# Patient Record
Sex: Male | Born: 1935 | Race: White | Hispanic: No | Smoking: Never smoker
Health system: Southern US, Community
[De-identification: ages and names within clinical notes are randomized; demographics above are authoritative.]

## PROBLEM LIST (undated history)

## (undated) DIAGNOSIS — K219 Gastro-esophageal reflux disease without esophagitis: Secondary | ICD-10-CM

## (undated) DIAGNOSIS — Z978 Presence of other specified devices: Secondary | ICD-10-CM

## (undated) DIAGNOSIS — Z8546 Personal history of malignant neoplasm of prostate: Secondary | ICD-10-CM

## (undated) DIAGNOSIS — M8580 Other specified disorders of bone density and structure, unspecified site: Secondary | ICD-10-CM

## (undated) DIAGNOSIS — I4819 Other persistent atrial fibrillation: Secondary | ICD-10-CM

## (undated) DIAGNOSIS — D509 Iron deficiency anemia, unspecified: Secondary | ICD-10-CM

## (undated) DIAGNOSIS — I5022 Chronic systolic (congestive) heart failure: Secondary | ICD-10-CM

## (undated) DIAGNOSIS — I4891 Unspecified atrial fibrillation: Secondary | ICD-10-CM

## (undated) DIAGNOSIS — C61 Malignant neoplasm of prostate: Secondary | ICD-10-CM

## (undated) DIAGNOSIS — E785 Hyperlipidemia, unspecified: Secondary | ICD-10-CM

## (undated) DIAGNOSIS — N182 Chronic kidney disease, stage 2 (mild): Secondary | ICD-10-CM

## (undated) DIAGNOSIS — M5010 Cervical disc disorder with radiculopathy, unspecified cervical region: Secondary | ICD-10-CM

## (undated) DIAGNOSIS — M4800 Spinal stenosis, site unspecified: Secondary | ICD-10-CM

## (undated) DIAGNOSIS — I350 Nonrheumatic aortic (valve) stenosis: Secondary | ICD-10-CM

## (undated) HISTORY — DX: Spinal stenosis, site unspecified: M48.00

## (undated) HISTORY — DX: Gastro-esophageal reflux disease without esophagitis: K21.9

## (undated) HISTORY — DX: Chronic kidney disease, stage 2 (mild): N18.2

## (undated) HISTORY — DX: Iron deficiency anemia, unspecified: D50.9

## (undated) HISTORY — DX: Other specified disorders of bone density and structure, unspecified site: M85.80

## (undated) HISTORY — DX: Nonrheumatic aortic (valve) stenosis: I35.0

## (undated) HISTORY — DX: Chronic systolic (congestive) heart failure: I50.22

## (undated) HISTORY — DX: Cervical disc disorder with radiculopathy, unspecified cervical region: M50.10

## (undated) HISTORY — DX: Hyperlipidemia, unspecified: E78.5

## (undated) HISTORY — PX: BACK SURGERY: SHX140

---
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
## 2019-12-23 ENCOUNTER — Ambulatory Visit: Payer: Medicare PPO | Attending: Internal Medicine

## 2019-12-23 DIAGNOSIS — Z23 Encounter for immunization: Secondary | ICD-10-CM

## 2019-12-31 MED ORDER — GENERIC EXTERNAL MEDICATION
12.50 | Status: DC
Start: 2019-12-31 — End: 2019-12-31

## 2019-12-31 MED ORDER — DEXTROSE 10 % IV SOLN
125.00 | INTRAVENOUS | Status: DC
Start: ? — End: 2019-12-31

## 2019-12-31 MED ORDER — GLUCAGON (RDNA) 1 MG IJ KIT
1.00 | PACK | INTRAMUSCULAR | Status: DC
Start: ? — End: 2019-12-31

## 2019-12-31 MED ORDER — GENERIC EXTERNAL MEDICATION
500.00 | Status: DC
Start: 2020-01-01 — End: 2019-12-31

## 2019-12-31 MED ORDER — ACETAMINOPHEN 325 MG PO TABS
650.00 | ORAL_TABLET | ORAL | Status: DC
Start: ? — End: 2019-12-31

## 2019-12-31 MED ORDER — HEPARIN SOD (PORCINE) IN D5W 100 UNIT/ML IV SOLN
30.00 | INTRAVENOUS | Status: DC
Start: ? — End: 2019-12-31

## 2019-12-31 MED ORDER — GLUCOSE 40 % PO GEL
15.00 | ORAL | Status: DC
Start: ? — End: 2019-12-31

## 2019-12-31 MED ORDER — BENZONATATE 100 MG PO CAPS
100.00 | ORAL_CAPSULE | ORAL | Status: DC
Start: ? — End: 2019-12-31

## 2019-12-31 MED ORDER — HEPARIN SOD (PORCINE) IN D5W 100 UNIT/ML IV SOLN
60.00 | INTRAVENOUS | Status: DC
Start: ? — End: 2019-12-31

## 2019-12-31 MED ORDER — GENERIC EXTERNAL MEDICATION
1.00 | Status: DC
Start: 2020-01-06 — End: 2019-12-31

## 2019-12-31 MED ORDER — DIGOXIN 0.25 MG/ML IJ SOLN
0.25 | INTRAMUSCULAR | Status: DC
Start: 2019-12-31 — End: 2019-12-31

## 2019-12-31 MED ORDER — HEPARIN SOD (PORCINE) IN D5W 100 UNIT/ML IV SOLN
12.00 | INTRAVENOUS | Status: DC
Start: ? — End: 2019-12-31

## 2019-12-31 MED ORDER — GUAIFENESIN 100 MG/5ML PO SYRP
400.00 | ORAL_SOLUTION | ORAL | Status: DC
Start: ? — End: 2019-12-31

## 2019-12-31 MED ORDER — INSULIN LISPRO 100 UNIT/ML ~~LOC~~ SOLN
0.00 | SUBCUTANEOUS | Status: DC
Start: 2019-12-31 — End: 2019-12-31

## 2020-01-01 ENCOUNTER — Ambulatory Visit: Payer: Medicare PPO | Admitting: Cardiovascular Disease

## 2020-01-06 MED ORDER — AMIODARONE HCL 200 MG PO TABS
200.00 | ORAL_TABLET | ORAL | Status: DC
Start: 2020-01-06 — End: 2020-01-06

## 2020-01-06 MED ORDER — METOPROLOL TARTRATE 25 MG PO TABS
25.00 | ORAL_TABLET | ORAL | Status: DC
Start: 2020-01-08 — End: 2020-01-06

## 2020-01-06 MED ORDER — TEMAZEPAM 15 MG PO CAPS
15.00 | ORAL_CAPSULE | ORAL | Status: DC
Start: ? — End: 2020-01-06

## 2020-01-08 MED ORDER — AMIODARONE HCL 200 MG PO TABS
200.00 | ORAL_TABLET | ORAL | Status: DC
Start: 2020-01-09 — End: 2020-01-08

## 2020-01-14 ENCOUNTER — Ambulatory Visit: Payer: Medicare PPO | Attending: Internal Medicine

## 2020-01-24 ENCOUNTER — Encounter: Payer: Self-pay | Admitting: Physician Assistant

## 2020-01-30 NOTE — Progress Notes (Addendum)
Structural Heart Clinic Consult Note  Chief Complaint  Patient presents with  . New Patient (Initial Visit)    Severe AS   History of Present Illness: 86 yo male with history of persistent atrial fibrillation, GERD, HLD, CKD stage 2, chronic systolic CHF, non-ischemic cardiomyopathy, prostate cancer, iron deficiency anemia, mild memory issues and severe aortic stenosis who is here today as a new consult for further evaluation of his aortic stenosis and discussion regarding TAVR. His echo from 12/10/19 with LVEF=60-65%. The mean gradient across the aortic valve was 72 mmHg and the peak gradient was 109 mmHg. AVA 0.3 cm2.  He was admitted to [facility] March 2021 with weakness and shortness of breath and was found to have rapid atrial fibrillation, elevated troponin and acute volume overload. He was found to have large bilateral pleural effusion as well as pneumonia. He was treated with rate control, anticoagulated, diuresed and treated with antibiotics. Echo 12/31/19 with LVEF=20-25%. Mild to moderate mitral regurgitation. Severe aortic stenosis on echo felt to be low flow, low gradient AS. Cardiac cath 01/06/20 with reported normal coronary arteries.  He is now in Eliquis.   He tells me today that he has had progressive dyspnea and fatigue. No chest pain. He lives with his son in [place]. He goes to the dentist regularly. No active issues. He is retired from [employer].   Primary Care Physician: Franchot Mimes, MD Primary Cardiologist: Claudie Leach Roque Cash, PA-C) Referring Cardiologist: Claudie Leach Wannetta Sender)  Past Medical History:  Diagnosis Date  . A-fib ([place])   . Acid reflux   . Aortic stenosis    SEVERE  . Cervical disc disorder with radiculopathy    2012 C6 STENOSIS 2' TO BROAD-BASED DISC PROTRUSION  . Chronic systolic (congestive) heart failure ([place])   . CKD (chronic kidney disease) stage 2, GFR 60-89 ml/min   . GERD (gastroesophageal reflux disease)     . HLD (hyperlipidemia)   . Iron deficiency anemia   . Osteopenia   . Prostate cancer ([place])   . Severe aortic stenosis   . Spinal stenosis    RESECTION JAN 2011 LUMBAR SPINE    Past Surgical History:  Procedure Laterality Date  . BACK SURGERY      Current Outpatient Medications  Medication Sig Dispense Refill  . amiodarone (PACERONE) 200 MG tablet Take 200 mg by mouth daily.    Marland Kitchen ELIQUIS 5 MG TABS tablet Take 5 mg by mouth in the morning and at bedtime.    . finasteride (PROSCAR) 5 MG tablet Take 5 mg by mouth daily.    . Levothyroxine Sodium 25 MCG CAPS Take 25 mcg by mouth daily.    . metoprolol tartrate (LOPRESSOR) 25 MG tablet Take 12.5 mg by mouth 2 (two) times daily.     . Multiple Vitamin (MULTI-VITAMIN) tablet Take 1 tablet by mouth daily.    . Omega-3 1000 MG CAPS Take 1 capsule by mouth daily.    Marland Kitchen torsemide (DEMADEX) 100 MG tablet Take 100 mg by mouth daily.     No current facility-administered medications for this visit.    No Known Allergies  Social History   Socioeconomic History  . Marital status: Widowed    Spouse name: Not on file  . Number of children: 2  . Years of education: Not on file  . Highest education level: Not on file  Occupational History  . Occupation: Retired from [employer]  . Smoking status:  Never Smoker  . Smokeless tobacco: Never Used  Substance and Sexual Activity  . Alcohol use: Yes    Comment: social  . Drug use: Never  . Sexual activity: Not on file  Other Topics Concern  . Not on file  Social History Narrative  . Not on file   Social Determinants of Health   Financial Resource Strain:   . Difficulty of Paying Living Expenses:   Food Insecurity:   . Worried About Charity fundraiser in the Last Year:   . Arboriculturist in the Last Year:   Transportation Needs:   . Film/video editor (Medical):   Marland Kitchen Lack of Transportation (Non-Medical):   Physical Activity:   . Days of Exercise per Week:   .  Minutes of Exercise per Session:   Stress:   . Feeling of Stress :   Social Connections:   . Frequency of Communication with Friends and Family:   . Frequency of Social Gatherings with Friends and Family:   . Attends Religious Services:   . Active Member of Clubs or Organizations:   . Attends Archivist Meetings:   Marland Kitchen Marital Status:   Intimate Partner Violence:   . Fear of Current or Ex-Partner:   . Emotionally Abused:   Marland Kitchen Physically Abused:   . Sexually Abused:     Family History  Problem Relation Age of Onset  . Heart failure Mother   . Cancer Father     Review of Systems:  As stated in the HPI and otherwise negative.   BP 122/72   Pulse 95   Ht 5\' 8"  (1.727 m)   Wt 140 lb 9.6 oz (63.8 kg)   SpO2 96%   BMI 21.38 kg/m   Physical Examination: General: Well developed, well nourished, NAD  HEENT: OP clear, mucus membranes moist  SKIN: warm, dry. No rashes. Neuro: No focal deficits  Musculoskeletal: Muscle strength 5/5 all ext  Psychiatric: Mood and affect normal  Neck: No JVD, no carotid bruits, no thyromegaly, no lymphadenopathy.  Lungs:Clear bilaterally, no wheezes, rhonci, crackles Cardiovascular: Regular rate and rhythm. Loud, harsh, late peaking systolic murmur.  Abdomen:Soft. Bowel sounds present. Non-tender.  Extremities: 1-2+ bilateral lower extremity edema.   EKG:  EKG is ordered today. The ekg ordered today demonstrates Atrial fib, LBBB  [facility]  SUMMARY Left ventricular systolic function is severely reduced. LV ejection fraction = 20-25%. Global LV hypokinesis with dyssynchronous septal motion, probably from bundle branch block. The right ventricle is mildly dilated. The right ventricular systolic function is moderately reduced. The left atrium is severely dilated. The right atrium is moderately dilated. There is low flow (<35 ml/m2) low gradient severe aortic stenosis. There is mild to  moderate mitral regurgitation. There is mild tricuspid regurgitation. Mild pulmonary hypertension. Estimated right ventricular systolic pressure is 123XX123 mmHg. There is no comparison study available.  - FINDINGS: LEFT VENTRICLE There is mild concentric left ventricular hypertrophy. The left ventricular size is normal. LV ejection fraction = 20-25%. Left ventricular systolic function is severely reduced. Left ventricular filling pattern is indeterminate. Global LV hypokinesis with dyssynchronous septal motion, probably from bundle branch block.  LV WALL MOTION  - RIGHT VENTRICLE The right ventricle is mildly dilated. The right ventricular systolic function is moderately reduced.  LEFT ATRIUM The left atrium is severely dilated.  RIGHT ATRIUM The right atrium is moderately dilated. There is no Doppler evidence for a patent foramen ovale. -  AORTIC VALVE Diffuse calcification of the aortic valve. Diffuse thickening of the aortic valve with restricted cusp opening. There is no aortic regurgitation. There is low flow (<35 ml/m2) low gradient severe aortic stenosis. - MITRAL VALVE The mitral valve leaflets appear normal. There is mild to moderate mitral regurgitation. No significant mitral valve stenosis. - TRICUSPID VALVE Structurally normal tricuspid valve. There is mild tricuspid regurgitation. Mild pulmonary hypertension. Estimated right ventricular systolic pressure is 123XX123 mmHg. - PULMONIC VALVE The pulmonic valve is not well visualized. There is no pulmonic valvular regurgitation. There is no pulmonic valvular stenosis. - ARTERIES The ascending aorta is normal size. - VENOUS The inferior vena cava is moderately dilated with a decrease in inspiratory collapse. - EFFUSION There is no pericardial effusion.  Cardiac Cath March 2021: No evidence of CAD  Recent Labs: No results found for requested labs within last 8760 hours.   Lipid Panel No results  found for: CHOL, TRIG, HDL, CHOLHDL, VLDL, LDLCALC, LDLDIRECT   Wt Readings from Last 3 Encounters:  01/31/20 140 lb 9.6 oz (63.8 kg)     Other studies Reviewed: Additional studies/ records that were reviewed today include: office notes, labs, hospital records, echo report, cath report.  Review of the above records demonstrates: severe AS   Assessment and Plan:   1. Severe Aortic Valve Stenosis: He has severe, stage D2 aortic valve stenosis. I cannot review the echo images from [facility]. The aortic valve is reported to be thickened, calcified with limited leaflet mobility. Low flow, low gradient aortic stenosis. His LV function was normal by echo six weeks ago and the mean gradient across the aortic valve was over 70 mmHg.  I think he would benefit from AVR. Given advanced age, he is not a good candidate for conventional AVR by surgical approach. I think he may be a good candidate for TAVR.   STS Risk Score: Risk of Mortality: 2.169% Renal Failure: 2.195% Permanent Stroke: 2.750% Prolonged Ventilation: 7.317% DSW Infection: 0.061% Reoperation: 4.780% Morbidity or Mortality: 14.718% Short Length of Stay: 20.797% Long Length of Stay: 8.498%  I have reviewed the natural history of aortic stenosis with the patient and their family members  who are present today. We have discussed the limitations of medical therapy and the poor prognosis associated with symptomatic aortic stenosis. We have reviewed potential treatment options, including palliative medical therapy, conventional surgical aortic valve replacement, and transcatheter aortic valve replacement. We discussed treatment options in the context of the patient's specific comorbid medical conditions.   He would like to proceed with planning for TAVR. Cardiac cath has been completed. Risks and benefits of the valve procedure are reviewed with the patient. I will arrange a cardiac CT, CTA of the chest/abdomen and pelvis, carotid  artery dopplers, PT assessment and he will then be referred to see one of the CT surgeons on our TAVR team.      Current medicines are reviewed at length with the patient today.  The patient does not have concerns regarding medicines.  The following changes have been made:  no change  Labs/ tests ordered today include:   Orders Placed This Encounter  Procedures  . EKG 12-Lead    Disposition:   F?U with the valve team.    Signed, [name], MD 01/31/2020 10:28 AM    [address], [place], [place]  [address] Phone: [phone]; Fax: [phone]

## 2020-01-31 ENCOUNTER — Encounter: Payer: Self-pay | Admitting: Cardiovascular Disease

## 2020-01-31 ENCOUNTER — Other Ambulatory Visit: Payer: Self-pay

## 2020-01-31 ENCOUNTER — Ambulatory Visit: Payer: Medicare PPO | Admitting: Cardiovascular Disease

## 2020-01-31 VITALS — BP 122/72 | HR 95 | Ht 68.0 in | Wt 140.6 lb

## 2020-01-31 DIAGNOSIS — I350 Nonrheumatic aortic (valve) stenosis: Secondary | ICD-10-CM

## 2020-01-31 NOTE — Patient Instructions (Signed)
Medication Instructions:  No changes *If you need a refill on your cardiac medications before your next appointment, please call your pharmacy*   Lab Work: none If you have labs (blood work) drawn today and your tests are completely normal, you will receive your results only by: Marland Kitchen MyChart Message (if you have MyChart) OR . A paper copy in the mail If you have any lab test that is abnormal or we need to change your treatment, we will call you to review the results.   Testing/Procedures: Theodosia Quay, RN, Structural Heart Nurse Navigator will contact you regarding further testing plans.

## 2020-03-04 ENCOUNTER — Other Ambulatory Visit: Payer: Self-pay

## 2020-03-04 DIAGNOSIS — I350 Nonrheumatic aortic (valve) stenosis: Secondary | ICD-10-CM

## 2020-03-06 ENCOUNTER — Other Ambulatory Visit: Payer: Self-pay

## 2020-03-06 ENCOUNTER — Encounter: Payer: Self-pay | Admitting: Cardiology

## 2020-03-06 ENCOUNTER — Ambulatory Visit
Admission: RE | Admit: 2020-03-06 | Discharge: 2020-03-06 | Disposition: A | Discharge status: Home / Self Care | Payer: Self-pay | Source: Ambulatory Visit | Attending: Cardiovascular Disease | Admitting: Cardiovascular Disease

## 2020-03-06 DIAGNOSIS — I350 Nonrheumatic aortic (valve) stenosis: Secondary | ICD-10-CM

## 2020-03-11 ENCOUNTER — Ambulatory Visit (HOSPITAL_COMMUNITY)
Admission: RE | Admit: 2020-03-11 | Discharge: 2020-03-11 | Disposition: A | Discharge status: Home / Self Care | Payer: Medicare PPO | Source: Ambulatory Visit | Attending: Cardiovascular Disease | Admitting: Cardiovascular Disease

## 2020-03-11 ENCOUNTER — Other Ambulatory Visit: Payer: Self-pay

## 2020-03-11 DIAGNOSIS — I350 Nonrheumatic aortic (valve) stenosis: Secondary | ICD-10-CM

## 2020-03-11 MED ORDER — IOHEXOL 350 MG/ML SOLN
100.0000 mL | Freq: Once | INTRAVENOUS | Status: AC | PRN
  Administered 2020-03-11: 100 mL via INTRAVENOUS

## 2020-03-11 NOTE — Progress Notes (Signed)
Paged Dr. Audie Box in reference to patient having low blood pressure upon arrival. Patient is on amiodarone and metoprolol daily and has no way to check his BP at home. Heart in the 123456 and BP systolic in the 123XX123 bilaterally. Patient is having no active symptoms with low BP. Denies any CP or shortness of breath. Dr Audie Box recommends patient calls his PCP in regards to blood pressure and to hold blood pressure medications for remainder of the day.

## 2020-03-24 ENCOUNTER — Encounter: Payer: Medicare PPO | Admitting: Thoracic Surgery (Cardiothoracic Vascular Surgery)

## 2020-03-25 ENCOUNTER — Other Ambulatory Visit: Payer: Self-pay

## 2020-03-25 DIAGNOSIS — I350 Nonrheumatic aortic (valve) stenosis: Secondary | ICD-10-CM

## 2020-03-26 ENCOUNTER — Other Ambulatory Visit: Payer: Self-pay

## 2020-03-26 DIAGNOSIS — I350 Nonrheumatic aortic (valve) stenosis: Secondary | ICD-10-CM

## 2020-03-26 NOTE — Progress Notes (Signed)
[id number] - [place], [address] [place] [address] Phone: [phone] Fax: [phone]    Your procedure is scheduled on Tuesday, June 22nd  Report to [place] Entrance "A" at 8:30 A.M., and check in at the Admitting office.  Call this number if you have problems the morning of surgery:  [phone]  Call [phone] if you have any questions prior to your surgery date Monday-Friday 8am-4pm   Remember:  Do not eat or drink after midnight the night before your surgery  Per surgeon's order:  --On the morning of surgery do NOT take any medications unless advised by Dr Roxy Manns. --STOP Eliquis 03/26/20, last dose 03/25/20. --Continue taking all other current medications without change through the day before surgery.   As of today, STOP taking any Aspirin (unless otherwise instructed by your surgeon) and Aspirin containing products, Aleve, Naproxen, Ibuprofen, Motrin, Advil, Goody's, BC's, all herbal medications, fish oil, and all vitamins.                     Do not wear jewelry.            Do not wear lotions, powders, perfumes, or deodorant.            Men may shave face and neck.            Do not bring valuables to the hospital.            [facility] is not responsible for any belongings or valuables.  Do NOT Smoke (Tobacco/Vapping) or drink Alcohol 24 hours prior to your procedure If you use a CPAP at night, you may bring all equipment for your overnight stay.   Contacts, glasses, dentures or bridgework may not be worn into surgery.      For patients admitted to the hospital, discharge time will be determined by your treatment team.   Patients discharged the day of surgery will not be allowed to drive home, and someone needs to stay with them for 24 hours.  Special instructions:   [place]- Preparing For Surgery  Before surgery, you can play an important role. Because skin is not sterile,  your skin needs to be as free of germs as possible. You can reduce the number of germs on your skin by washing with CHG (chlorahexidine gluconate) Soap before surgery.  CHG is an antiseptic cleaner which kills germs and bonds with the skin to continue killing germs even after washing.    Oral Hygiene is also important to reduce your risk of infection.  Remember - BRUSH YOUR TEETH THE MORNING OF SURGERY WITH YOUR REGULAR TOOTHPASTE  Please do not use if you have an allergy to CHG or antibacterial soaps. If your skin becomes reddened/irritated stop using the CHG.  Do not shave (including legs and underarms) for at least 48 hours prior to first CHG shower. It is OK to shave your face.  Please follow these instructions carefully.   1. Shower the NIGHT BEFORE SURGERY and the MORNING OF SURGERY with CHG Soap.   2. If you chose to wash your hair, wash your hair first as usual with your normal shampoo.  3. After you shampoo, rinse your hair and body thoroughly to remove the shampoo.  4. Use CHG as you would any other liquid soap. You can apply CHG directly to the skin and wash gently with a scrungie or a clean washcloth.  5. Apply the CHG Soap to your body ONLY FROM THE NECK DOWN.  Do not use on open wounds or open sores. Avoid contact with your eyes, ears, mouth and genitals (private parts). Wash Face and genitals (private parts)  with your normal soap.   6. Wash thoroughly, paying special attention to the area where your surgery will be performed.  7. Thoroughly rinse your body with warm water from the neck down.  8. DO NOT shower/wash with your normal soap after using and rinsing off the CHG Soap.  9. Pat yourself dry with a CLEAN TOWEL.  10. Wear CLEAN PAJAMAS to bed the night before surgery, wear comfortable clothes the morning of surgery  11. Place CLEAN SHEETS on your bed the night of your first shower and DO NOT SLEEP WITH PETS.  Day of Surgery: Shower with CHG soap as instructed  above.  Do not apply any deodorants/lotions.  Please wear clean clothes to the hospital/surgery center.   Remember to brush your teeth WITH YOUR REGULAR TOOTHPASTE.   Please read over the following fact sheets that you were given.

## 2020-03-27 ENCOUNTER — Other Ambulatory Visit (HOSPITAL_COMMUNITY): Payer: Medicare PPO

## 2020-03-27 ENCOUNTER — Ambulatory Visit (HOSPITAL_COMMUNITY)
Admission: RE | Admit: 2020-03-27 | Discharge: 2020-03-27 | Disposition: A | Discharge status: Home / Self Care | Payer: Medicare PPO | Source: Ambulatory Visit | Attending: Cardiovascular Disease | Admitting: Cardiovascular Disease

## 2020-03-27 ENCOUNTER — Encounter (HOSPITAL_COMMUNITY): Payer: Self-pay

## 2020-03-27 ENCOUNTER — Ambulatory Visit: Payer: Medicare PPO | Attending: Cardiovascular Disease | Admitting: Physical Therapy

## 2020-03-27 ENCOUNTER — Encounter: Payer: Self-pay | Admitting: Physical Therapy

## 2020-03-27 ENCOUNTER — Encounter: Payer: Self-pay | Admitting: Thoracic Surgery (Cardiothoracic Vascular Surgery)

## 2020-03-27 ENCOUNTER — Other Ambulatory Visit: Payer: Self-pay

## 2020-03-27 ENCOUNTER — Encounter (HOSPITAL_COMMUNITY): Payer: Medicare PPO

## 2020-03-27 ENCOUNTER — Other Ambulatory Visit (HOSPITAL_COMMUNITY)
Admission: RE | Admit: 2020-03-27 | Discharge: 2020-03-27 | Disposition: A | Discharge status: Home / Self Care | Payer: Medicare PPO | Source: Ambulatory Visit | Attending: Cardiovascular Disease | Admitting: Cardiovascular Disease

## 2020-03-27 ENCOUNTER — Institutional Professional Consult (permissible substitution): Payer: Medicare PPO | Admitting: Thoracic Surgery (Cardiothoracic Vascular Surgery)

## 2020-03-27 ENCOUNTER — Encounter (HOSPITAL_COMMUNITY)
Admission: RE | Admit: 2020-03-27 | Discharge: 2020-03-27 | Disposition: A | Discharge status: Home / Self Care | Payer: Medicare PPO | Source: Ambulatory Visit | Attending: Cardiovascular Disease | Admitting: Cardiovascular Disease

## 2020-03-27 DIAGNOSIS — I447 Left bundle-branch block, unspecified: Secondary | ICD-10-CM | POA: Insufficient documentation

## 2020-03-27 DIAGNOSIS — Z20822 Contact with and (suspected) exposure to covid-19: Secondary | ICD-10-CM | POA: Diagnosis not present

## 2020-03-27 DIAGNOSIS — R9431 Abnormal electrocardiogram [ECG] [EKG]: Secondary | ICD-10-CM | POA: Insufficient documentation

## 2020-03-27 DIAGNOSIS — I6523 Occlusion and stenosis of bilateral carotid arteries: Secondary | ICD-10-CM | POA: Insufficient documentation

## 2020-03-27 DIAGNOSIS — R2689 Other abnormalities of gait and mobility: Secondary | ICD-10-CM | POA: Diagnosis not present

## 2020-03-27 DIAGNOSIS — I350 Nonrheumatic aortic (valve) stenosis: Secondary | ICD-10-CM

## 2020-03-27 DIAGNOSIS — Z01818 Encounter for other preprocedural examination: Secondary | ICD-10-CM | POA: Insufficient documentation

## 2020-03-27 DIAGNOSIS — I498 Other specified cardiac arrhythmias: Secondary | ICD-10-CM | POA: Insufficient documentation

## 2020-03-27 LAB — URINALYSIS, ROUTINE W REFLEX MICROSCOPIC
Bilirubin Urine: NEGATIVE
Glucose, UA: NEGATIVE mg/dL
Ketones, ur: NEGATIVE mg/dL
Nitrite: NEGATIVE
Protein, ur: NEGATIVE mg/dL
Specific Gravity, Urine: 1.006 (ref 1.005–1.030)
WBC, UA: >50 WBC/hpf — ABNORMAL HIGH (ref 0–5)
pH: 6 (ref 5.0–8.0)

## 2020-03-27 LAB — COMPREHENSIVE METABOLIC PANEL
ALT: 15 U/L (ref 0–44)
AST: 20 U/L (ref 15–41)
Albumin: 3.4 g/dL — ABNORMAL LOW (ref 3.5–5.0)
Alkaline Phosphatase: 50 U/L (ref 38–126)
Anion gap: 12 (ref 5–15)
BUN: 18 mg/dL (ref 8–23)
CO2: 23 mmol/L (ref 22–32)
Calcium: 8.3 mg/dL — ABNORMAL LOW (ref 8.9–10.3)
Chloride: 105 mmol/L (ref 98–111)
Creatinine, Ser: 1.12 mg/dL (ref 0.61–1.24)
GFR calc Af Amer: >60 mL/min (ref >60)
GFR calc non Af Amer: 59 mL/min — ABNORMAL LOW (ref >60)
Glucose, Bld: 97 mg/dL (ref 70–99)
Potassium: 4.2 mmol/L (ref 3.5–5.1)
Sodium: 140 mmol/L (ref 135–145)
Total Bilirubin: 0.4 mg/dL (ref 0.3–1.2)
Total Protein: 6.6 g/dL (ref 6.5–8.1)

## 2020-03-27 LAB — BRAIN NATRIURETIC PEPTIDE: B Natriuretic Peptide: 709 pg/mL — ABNORMAL HIGH (ref 0.0–100.0)

## 2020-03-27 LAB — CBC
HCT: 30.8 % — ABNORMAL LOW (ref 39.0–52.0)
Hemoglobin: 9.4 g/dL — ABNORMAL LOW (ref 13.0–17.0)
MCH: 29.3 pg (ref 26.0–34.0)
MCHC: 30.5 g/dL (ref 30.0–36.0)
MCV: 96 fL (ref 80.0–100.0)
Platelets: 207 10*3/uL (ref 150–400)
RBC: 3.21 MIL/uL — ABNORMAL LOW (ref 4.22–5.81)
RDW: 14.9 % (ref 11.5–15.5)
WBC: 5.1 10*3/uL (ref 4.0–10.5)
nRBC: 0 % (ref 0.0–0.2)

## 2020-03-27 LAB — BLOOD GAS, ARTERIAL
Acid-Base Excess: 0.1 mmol/L (ref 0.0–2.0)
Bicarbonate: 23.7 mmol/L (ref 20.0–28.0)
Drawn by: 421801
FIO2: 21
O2 Saturation: 99.2 %
Patient temperature: 37
pCO2 arterial: 35.1 mmHg (ref 32.0–48.0)
pH, Arterial: 7.445 (ref 7.350–7.450)
pO2, Arterial: 128 mmHg — ABNORMAL HIGH (ref 83.0–108.0)

## 2020-03-27 LAB — HEMOGLOBIN A1C
Hgb A1c MFr Bld: 4.5 % — ABNORMAL LOW (ref 4.8–5.6)
Mean Plasma Glucose: 82.45 mg/dL

## 2020-03-27 LAB — SURGICAL PCR SCREEN
MRSA, PCR: NEGATIVE
Staphylococcus aureus: NEGATIVE

## 2020-03-27 LAB — ABO/RH: ABO/RH(D): AB POS

## 2020-03-27 NOTE — H&P (View-Only) (Signed)
[facility]  MULTIDISCIPLINARY HEART VALVE CLINIC  CARDIOTHORACIC SURGERY CONSULTATION REPORT  Referring Provider is Lennice Sites, Clarisse Gouge PCP is [facility] Complaint  Patient presents with  . Aortic Stenosis    Consultation    HPI:  Patient is an 86 year old widower with aortic stenosis, nonischemic cardiomyopathy, chronic systolic congestive heart failure, persistent atrial fibrillation, stage II chronic kidney disease with recent acute exacerbation due to acute kidney injury caused by bladder outlet obstruction, GE reflux disease, hyperlipidemia, prostate cancer, iron deficient anemia, and short-term memory loss who has been referred for surgical consultation to discuss treatment options for management of severe symptomatic aortic stenosis.  Patient does not recall how long he has known of the presence of a heart murmur but he has been followed for several years by Roque Cash at [facility] with known history of aortic stenosis.  Echocardiogram performed December 10, 2019 at [facility] revealed what was reported to be normal left ventricular systolic function with very severe aortic stenosis with peak velocity across aortic valve measured 5.2 m/s corresponding to peak and mean transvalvular gradients estimated 110 and 72 mmHg respectively and aortic valve area calculated 0.3 cm.  Shortly after that the patient was admitted to [facility] with severe generalized weakness and resting shortness of breath with new onset rapid atrial fibrillation.  Atrial fibrillation was treated with rate control and he was started on Eliquis for long-term anticoagulation.  He responded to intravenous diuretic therapy and antibiotics.  Echocardiogram performed December 31, 2019 was reported to demonstrate severe left ventricular systolic dysfunction with ejection fraction estimated only 20 to 25%.  There were findings consistent with severe  low-flow low gradient aortic stenosis.  Diagnostic cardiac catheterization was performed January 06, 2020 and revealed left ventricular systolic dysfunction with ejection fraction estimated 40 to 45%.  The patient had normal coronary artery anatomy with no significant coronary artery disease.  There was mild to moderate pulmonary hypertension.  Mean transvalvular gradient was 36 mmHg corresponding to aortic valve area calculated 0.88 cm.  Patient was referred to the multidisciplinary heart valve clinic and has been evaluated previously by Dr. Angelena Form.  CT angiography was performed and the patient was referred for surgical consultation.  During the interim period of time the patient has developed hematuria and bladder outlet obstruction with intermittent acute kidney injury.  He has been cared for by a urologist in [facility].  He has failed several voiding trials and now has an indwelling Foley catheter in place.  Long-term anticoagulation using Eliquis has been held due to gross hematuria.  Patient is accompanied for his office consultation visit today by his son who he lives with in [facility].  He has been retired for approximately 20 years.  He admits that he has been experiencing a slow gradual decline in his functional status over the last several years.  He states that this is primarily due to exertional shortness of breath and generalized weakness.  He now ambulates using a walker most of the time.  Up until recently he has still been driving an automobile but he has not been driving for the last 2 months.  He states that he gets tired quite easily with ambulation or any other kind of exertion.  He denies resting shortness of breath, PND, orthopnea, palpitations, dizzy spells, or syncope.  He has never had any chest pain or chest tightness either with activity or at rest.  He has  developed chronic lower extremity edema.  He states that he has a marginal appetite and he has lost probably 25 pounds in weight  over the last year or so.  He feels generalized weakness.  Past Medical History:  Diagnosis Date  . A-fib ([place])   . Acid reflux   . Aortic stenosis    SEVERE  . Cervical disc disorder with radiculopathy    2012 C6 STENOSIS 2' TO BROAD-BASED DISC PROTRUSION  . Chronic systolic (congestive) heart failure ([place])   . CKD (chronic kidney disease) stage 2, GFR 60-89 ml/min   . GERD (gastroesophageal reflux disease)   . HLD (hyperlipidemia)   . Iron deficiency anemia   . Osteopenia   . Prostate cancer ([place])   . Severe aortic stenosis   . Spinal stenosis    RESECTION JAN 2011 LUMBAR SPINE    Past Surgical History:  Procedure Laterality Date  . BACK SURGERY      Family History  Problem Relation Age of Onset  . Heart failure Mother   . Cancer Father     Social History   Socioeconomic History  . Marital status: Widowed    Spouse name: Not on file  . Number of children: 2  . Years of education: Not on file  . Highest education level: Not on file  Occupational History  . Occupation: Retired from [employer]  . Smoking status: Never Smoker  . Smokeless tobacco: Never Used  Substance and Sexual Activity  . Alcohol use: Yes    Comment: social  . Drug use: Never  . Sexual activity: Not on file  Other Topics Concern  . Not on file  Social History Narrative  . Not on file   Social Determinants of Health   Financial Resource Strain:   . Difficulty of Paying Living Expenses:   Food Insecurity:   . Worried About Charity fundraiser in the Last Year:   . Arboriculturist in the Last Year:   Transportation Needs:   . Film/video editor (Medical):   Marland Kitchen Lack of Transportation (Non-Medical):   Physical Activity:   . Days of Exercise per Week:   . Minutes of Exercise per Session:   Stress:   . Feeling of Stress :   Social Connections:   . Frequency of Communication with Friends and Family:   . Frequency of Social Gatherings with Friends and Family:   .  Attends Religious Services:   . Active Member of Clubs or Organizations:   . Attends Archivist Meetings:   Marland Kitchen Marital Status:   Intimate Partner Violence:   . Fear of Current or Ex-Partner:   . Emotionally Abused:   Marland Kitchen Physically Abused:   . Sexually Abused:     Current Outpatient Medications  Medication Sig Dispense Refill  . amiodarone (PACERONE) 200 MG tablet Take 200 mg by mouth daily.    . finasteride (PROSCAR) 5 MG tablet Take 5 mg by mouth daily.    . furosemide (LASIX) 20 MG tablet Take 20 mg by mouth daily.    . Levothyroxine Sodium 25 MCG CAPS Take 25 mcg by mouth daily before breakfast.     . Multiple Vitamin (MULTI-VITAMIN) tablet Take 1 tablet by mouth daily.    . Omega-3 1000 MG CAPS Take 1,000 mg by mouth daily.     . potassium chloride (KLOR-CON) 10 MEQ tablet Take 10 mEq by mouth daily.    Marland Kitchen ELIQUIS 5 MG TABS  tablet Take 5 mg by mouth in the morning and at bedtime. (Patient not taking: Reported on 03/27/2020)    . metoprolol tartrate (LOPRESSOR) 25 MG tablet Take 12.5 mg by mouth 2 (two) times daily.  (Patient not taking: Reported on 03/27/2020)     No current facility-administered medications for this visit.    No Known Allergies    Review of Systems:   General:  decreased appetite, decreased energy, no weight gain, + weight loss, no fever  Cardiac:  no chest pain with exertion, no chest pain at rest, +SOB with exertion, mp resting SOB, mp PND, mp orthopnea, mp palpitations, + arrhythmia, + atrial fibrillation, + LE edema, no dizzy spells, no syncope  Respiratory:  + shortness of breath, no home oxygen, no productive cough, no dry cough, no bronchitis, no wheezing, no hemoptysis, no asthma, no pain with inspiration or cough, no sleep apnea, no CPAP at night  GI:   no difficulty swallowing, no reflux, no frequent heartburn, no hiatal hernia, no abdominal pain, no constipation, no diarrhea, no hematochezia, no hematemesis, no melena  GU:   no dysuria,  +  frequency, no urinary tract infection, + hematuria, + enlarged prostate, no kidney stones, + kidney disease  Vascular:  no pain suggestive of claudication, no pain in feet, no leg cramps, no varicose veins, no DVT, no non-healing foot ulcer  Neuro:   no stroke, no TIA's, no seizures, no headaches, no temporary blindness one eye,  no slurred speech, no peripheral neuropathy, no chronic pain, no instability of gait, mild memory/cognitive dysfunction  Musculoskeletal: no arthritis, no joint swelling, no myalgias, + difficulty walking, limited mobility   Skin:   no rash, no itching, no skin infections, no pressure sores or ulcerations  Psych:   no anxiety, no depression, no nervousness, no unusual recent stress  Eyes:   no blurry vision, no floaters, no recent vision changes, + wears glasses or contacts  ENT:   no hearing loss, no loose or painful teeth, no dentures, last saw dentist March 2021  Hematologic:  + easy bruising, no abnormal bleeding, no clotting disorder, no frequent epistaxis  Endocrine:  no diabetes, does not check CBG's at home           Physical Exam:   BP 93/62   Pulse 72   Temp 97.7 F (36.5 C) (Temporal)   Resp 20   Ht 5\' 8"  (1.727 m)   Wt 138 lb (62.6 kg)   SpO2 96% Comment: RA  BMI 20.98 kg/m   General:  Thin, elderly and somewhat frail-appearing  HEENT:  Unremarkable   Neck:   no JVD, no bruits, no adenopathy   Chest:   clear to auscultation, symmetrical breath sounds, no wheezes, no rhonchi   CV:   Irregular rate and rhythm, grade IV/VI crescendo/decrescendo murmur heard best at LLSB,  no diastolic murmur  Abdomen:  soft, non-tender, no masses   Extremities:  warm, well-perfused, pulses diminished, 2+ bilateral LE edema  Rectal/GU  Deferred  Neuro:   Grossly non-focal and symmetrical throughout  Skin:   Clean and dry, no rashes, no breakdown   Diagnostic Tests:  ECHOCARDIOGRAM  Both report and images from transthoracic echocardiogram performed December 31, 2019 I reviewed.  There is severe global left ventricular systolic dysfunction.  Ejection fraction was estimated 20 to 25%.  Patient is in atrial fibrillation.  There is severe aortic stenosis.  The aortic valve is trileaflet with severe thickening, calcification, and restricted leaflet mobility  involving all 3 leaflets of the aortic valve.  Images with hemodynamic measurements were not captured or available for my personal review but by report the patient had findings consistent with severe low-flow low gradient aortic stenosis.  Report from transthoracic echocardiogram performed several weeks earlier at [facility] revealed very severe aortic stenosis with peak velocity across the aortic valve measured greater than 5.2 m/s.  At that time left ventricular function was felt to be normal.   DIAGNOSTIC CARDIAC CATHETERIZATION  Both images and report from diagnostic cardiac catheterization performed January 06, 2020 at [facility] are reviewed.  Patient has moderate global left ventricular systolic dysfunction with ejection fraction estimated 40 to 45%.  There was normal coronary artery anatomy with no significant coronary artery disease.  Mean transvalvular gradient at catheterization was measured 36 mmHg corresponding to aortic valve area calculated 0.88 cm by Fick calculation.  There was mild to moderate pulmonary hypertension.    Cardiac TAVR CT  TECHNIQUE: The patient was scanned on a [employer]. A 120 kV retrospective scan was triggered in the descending thoracic aorta at 111 HU's. Gantry rotation speed was 250 msecs and collimation was .6 mm. No beta blockade or nitro were given. The 3D data set was reconstructed in 5% intervals of the R-R cycle. Systolic and diastolic phases were analyzed on a dedicated work station using MPR, MIP and VRT modes. The patient received 80 cc of contrast.  FINDINGS: Image quality: Excellent.  Noise artifact  is: Limited.  Valve Morphology: The aortic valve is tricuspid. The tricuspid valve is severely calcified with bulky calcification of the RCC and NCC extending to the base of the leaflets.  Aortic Valve Calcium score: 4715  Aortic annular dimension:  Phase assessed: 20%  Annular area: 495 mm2  Annular perimeter: 80.7 mm  Max diameter: 28.3 mm  Min diameter: 22.6 mm  Annular and subannular calcification: Minimal, spotty annular calcification noted. Minimal LVOT calcification extending from the [place].  Optimal coplanar projection: RAO 23 CAU 59. There is extreme rotation of the cardiac structures due to the anterior course of the aortic arch. This has resulted in extreme counter-clockwise rotation of the aortic root. There is no apparent congenital anomaly.  Coronary Artery Height above Annulus:  Left Main: 19 mm  Right Coronary: 19.3 mm  Sinus of Valsalva Measurements:  Non-coronary: 32 mm  Right-coronary: 32 mm  Left-coronary: 31 mm  Sinus of Valsalva Height:  Non-coronary: 21.3 mm  Right-coronary: 23.1 mm  Left-coronary: 26.9 mm  Sinotubular Junction: 29 mm  Ascending Thoracic Aorta: 34 mm  Coronary Arteries: Normal coronary origin. Right dominance. The study was performed without use of NTG and is insufficient for plaque evaluation. However, there appears to be minimal calcified plaque (<25%) in the LAD and RCA. The LCX is patent.  Cardiac Morphology:  Right Atrium: Right atrial size is within normal limits.  Right Ventricle: The right ventricular cavity is within normal limits.  Left Atrium: Left atrial size is normal in size with no left atrial appendage filling defect.  Left Ventricle: The ventricular cavity size is within normal limits. There are no stigmata of prior infarction. There is no abnormal filling defect. Normal left ventricular function (LVEF=68%). No regional wall motion abnormalities.  Pulmonary  arteries: Normal in size without proximal filling defect.  Pulmonary veins: Normal pulmonary venous drainage.  Pericardium: Normal thickness with no significant effusion or calcium present.  Mitral Valve: The mitral valve is normal structure without significant calcification.  Extra-cardiac findings: See attached radiology report for non-cardiac structures.  IMPRESSION: 1. Annular measurements appropriate for 26 mm Edwards Sapien 3 TAVR.  2. Severe bulky calcifications noted on the RCC/NCC.  3. Minimal annular and subannular calcifications.  4. Sufficient coronary to annulus distance.  5. Optimal Fluoroscopic Angle for Delivery: RAO 23 CAU 59.  6. There is extreme rotation of the cardiac structures due to the anterior course of the aortic arch with very tortuous course. Resultant extreme counter-clockwise rotation of the aortic root noted. There is no apparent congenital anomaly.  Lake Bells T. O'Neal, MD   Electronically Signed   By: Eleonore Chiquito   On: 03/11/2020 17:47   CT CHEST, ABDOMEN AND PELVIS WITHOUT CONTRAST  TECHNIQUE: Multidetector CT imaging of the chest, abdomen and pelvis was performed following the standard protocol without IV contrast.  COMPARISON:  CT of the abdomen and pelvis 02/10/2020.  FINDINGS: CT CHEST FINDINGS  Cardiovascular: Heart size is normal. There is no significant pericardial fluid, thickening or pericardial calcification. There is aortic atherosclerosis, as well as atherosclerosis of the great vessels of the mediastinum and the coronary arteries, including calcified atherosclerotic plaque in the left anterior descending coronary artery. Severe thickening and calcification of the aortic valve.  Mediastinum/Nodes: No pathologically enlarged mediastinal or hilar lymph nodes. Large hiatal hernia. No axillary lymphadenopathy.  Lungs/Pleura: Moderate bilateral pleural effusions with some passive subsegmental  atelectasis in the lungs bilaterally. Atelectasis and/or post infectious or inflammatory scarring in the medial aspect of the left upper lobe. No definite suspicious appearing pulmonary nodules or masses are noted. No acute consolidative airspace disease.  Musculoskeletal: There are no aggressive appearing lytic or blastic lesions noted in the visualized portions of the skeleton.  CT ABDOMEN PELVIS FINDINGS  Hepatobiliary: No suspicious cystic or solid hepatic lesions. No intra or extrahepatic biliary ductal dilatation. Gallbladder is normal in appearance.  Pancreas: Multiple small low-attenuation lesions scattered throughout the body and tail of the pancreas, largest of which is in the distal body measuring 1.3 cm in diameter (axial image 109 of series 15). No peripancreatic fluid collections or inflammatory changes.  Spleen: Unremarkable.  Adrenals/Urinary Tract: 11 mm low-attenuation lesion in the lower pole of the left kidney, compatible with a simple cyst. Right kidney and bilateral adrenal glands are normal in appearance. No hydroureteronephrosis in the abdomen or pelvis. Foley balloon catheter in place within the lumen of the urinary bladder which is nearly decompressed. Mild subjective mural thickening in the urinary bladder which could be accentuated by under distension. Tiny amount of gas non dependently in the lumen of the urinary bladder, presumably iatrogenic.  Stomach/Bowel: Intra-abdominal portion of the stomach is unremarkable. No pathologic dilatation of small bowel or colon. Numerous colonic diverticulae are noted, without surrounding inflammatory changes to suggest an acute diverticulitis at this time. Normal appendix.  Vascular/Lymphatic: Aortic atherosclerosis, with vascular findings and measurements pertinent to potential TAVR procedure, as detailed below. No aneurysm or dissection noted in the abdominal or pelvic vasculature. No lymphadenopathy  noted in the abdomen or pelvis.  Reproductive: Brachytherapy implants throughout the prostate gland. Prostate gland and seminal vesicles are otherwise unremarkable in appearance.  Other: No significant volume of ascites.  No pneumoperitoneum.  Musculoskeletal: Chronic appearing compression fracture of L3 with 70% loss of central vertebral body height. Status post PLIF at L4-L5. There are no aggressive appearing lytic or blastic lesions noted in the visualized portions of the skeleton.  IMPRESSION: 1. Vascular findings and measurements pertinent to potential TAVR procedure, as detailed  above. 2. Severe thickening calcification of the aortic valve, compatible with reported clinical history of severe aortic stenosis. 3. Large hiatal hernia which may have implications for intraprocedural TEE monitoring. 4. Moderate bilateral pleural effusions. 5. Multiple small low-attenuation lesions scattered throughout the body and tail of the pancreas measuring up to 1.3 cm in diameter in the distal body. These are nonspecific, but the possibility of multifocal side branch IPMN (intraductal papillary mucinous neoplasm) should be considered. Given the confluency of these small lesions in the tail of the pancreas, further evaluation with nonemergent abdominal MRI with and without IV gadolinium with MRCP is recommended at this time to better evaluate these findings and establish a baseline for future follow-up examinations. 6. Severe colonic diverticulosis without evidence of acute diverticulitis at this time. 7. Additional incidental findings, as above.   Electronically Signed   By: Vinnie Langton M.D.   On: 03/11/2020 13:45    Impression:  Patient has stage D2 severe low-flow low gradient aortic stenosis.  He currently describes stable symptoms of exertional shortness of breath, fatigue, poor appetite, and lower extremity edema consistent with chronic combined systolic and diastolic  congestive heart failure, New York Heart Association functional class III.  Several months ago he was hospitalized at [facility] with class IV symptoms of heart failure in the setting of rapid atrial fibrillation.  He now remains in atrial fibrillation with rate control.  He had been on long-term anticoagulation using Eliquis until recently when it was placed on hold because of gross hematuria.  He has had problems with bladder outlet obstruction with recent episodes of acute kidney injury on top of chronic kidney disease stage II-III.  He has apparently failed multiple voiding trials and now has an indwelling Foley catheter in place.   I have personally reviewed the patient's most recent transthoracic echocardiogram and diagnostic cardiac catheterization, both of which were performed in [facility].  I have also reviewed the patient's recent CT angiograms.  The patient clearly has severe aortic stenosis.  The aortic valve is trileaflet with severely restricted leaflet mobility involving all 3 leaflets of the aortic valve.  By report left ventricular function was normal as recently has the first part of March of this year at which time echocardiogram revealed peak velocity across aortic valve greater than 5 m/s with ejection fraction estimated 65%.  Most recent echocardiogram reveals severe global left ventricular systolic dysfunction and peak velocity across aortic valve measured less than 4 m/s.  Diagnostic cardiac catheterization confirmed the presence of severe aortic stenosis and is notable for the absence of significant coronary artery disease.  There is no question the patient needs aortic valve replacement.  However, I would not consider this elderly and somewhat frail gentleman candidate for conventional surgical aortic valve replacement under any circumstances.  Cardiac-gated CTA of the heart reveals anatomical characteristics consistent with aortic stenosis suitable for treatment  by transcatheter aortic valve replacement without any significant complicating features.  CTA of the aorta and iliac vessels demonstrates adequate caliber vessels involving the descending aorta and iliac vessels, but there is extreme tortuosity of the thoracic aorta with an unusual angle of the heart that is at least in part related to the patient's large hiatal hernia.  This might make an attempt at transcatheter valve replacement via transfemoral approach quite challenging, and I favor use of alternative access via left subclavian approach.    Plan:  The patient and his son were counseled at length regarding treatment alternatives  for management of severe symptomatic aortic stenosis. Alternative approaches such as conventional aortic valve replacement, transcatheter aortic valve replacement, and continued medical therapy without intervention were compared and contrasted at length.  The risks associated with conventional surgical aortic valve replacement were discussed in detail, as were expectations for post-operative convalescence, and why I would be reluctant to consider this patient a candidate for conventional surgery.  Issues specific to transcatheter aortic valve replacement were discussed including questions about long term valve durability, the potential for paravalvular leak, possible increased risk of need for permanent pacemaker placement, and other technical complications related to the procedure itself.  Long-term prognosis with medical therapy was discussed. This discussion was placed in the context of the patient's own specific clinical presentation and past medical history.  The concerning presence of indwelling Foley catheter with associated risk for bladder infection and associated possibility of risk for prosthetic valve endocarditis was discussed.  However, because of the severity of LV dysfunction and aortic stenosis I agree the patient needs to have his aortic stenosis definitively  managed if he is to undergo any type of surgical intervention to deal with his bladder outlet obstruction.  I have encouraged the patient to consider proceeding with close follow-up with his urologist in [facility] once his valve has been replaced with hopes that he will not require long-term indwelling Foley catheter.  All of their questions have been addressed.  The patient desires to proceed with transcatheter aortic valve replacement as soon as possible.  We plan for surgery on 04-21-2020.  Following the decision to proceed with transcatheter aortic valve replacement, a discussion has been held regarding what types of management strategies would be attempted intraoperatively in the event of life-threatening complications, including whether or not the patient would be considered a candidate for the use of cardiopulmonary bypass and/or conversion to open sternotomy for attempted surgical intervention.  The patient specifically requests that should a potentially life-threatening complication develop we would not attempt emergency median sternotomy and/or other aggressive surgical procedures.  The patient has been advised of a variety of complications that might develop including but not limited to risks of death, stroke, paravalvular leak, aortic dissection or other major vascular complications, aortic annulus rupture, device embolization, cardiac rupture or perforation, mitral regurgitation, acute myocardial infarction, arrhythmia, heart block or bradycardia requiring permanent pacemaker placement, congestive heart failure, respiratory failure, renal failure, pneumonia, infection, other late complications related to structural valve deterioration or migration, or other complications that might ultimately cause a temporary or permanent loss of functional independence or other long term morbidity.  The patient provides full informed consent for the procedure as described and all questions were  answered.      I spent in excess of 90 minutes during the conduct of this office consultation and >50% of this time involved direct face-to-face encounter with the patient for counseling and/or coordination of their care.      Valentina Gu. Roxy Manns, MD 03/27/2020 10:10 AM

## 2020-03-27 NOTE — Progress Notes (Signed)
[facility]  MULTIDISCIPLINARY HEART VALVE CLINIC  CARDIOTHORACIC SURGERY CONSULTATION REPORT  Referring Provider is Lennice Sites, Clarisse Gouge PCP is [facility] Complaint  Patient presents with  . Aortic Stenosis    Consultation    HPI:  Patient is an 86 year old widower with aortic stenosis, nonischemic cardiomyopathy, chronic systolic congestive heart failure, persistent atrial fibrillation, stage II chronic kidney disease with recent acute exacerbation due to acute kidney injury caused by bladder outlet obstruction, GE reflux disease, hyperlipidemia, prostate cancer, iron deficient anemia, and short-term memory loss who has been referred for surgical consultation to discuss treatment options for management of severe symptomatic aortic stenosis.  Patient does not recall how long he has known of the presence of a heart murmur but he has been followed for several years by Roque Cash at [facility] with known history of aortic stenosis.  Echocardiogram performed December 10, 2019 at [facility] revealed what was reported to be normal left ventricular systolic function with very severe aortic stenosis with peak velocity across aortic valve measured 5.2 m/s corresponding to peak and mean transvalvular gradients estimated 110 and 72 mmHg respectively and aortic valve area calculated 0.3 cm.  Shortly after that the patient was admitted to [facility] with severe generalized weakness and resting shortness of breath with new onset rapid atrial fibrillation.  Atrial fibrillation was treated with rate control and he was started on Eliquis for long-term anticoagulation.  He responded to intravenous diuretic therapy and antibiotics.  Echocardiogram performed December 31, 2019 was reported to demonstrate severe left ventricular systolic dysfunction with ejection fraction estimated only 20 to 25%.  There were findings consistent with severe  low-flow low gradient aortic stenosis.  Diagnostic cardiac catheterization was performed January 06, 2020 and revealed left ventricular systolic dysfunction with ejection fraction estimated 40 to 45%.  The patient had normal coronary artery anatomy with no significant coronary artery disease.  There was mild to moderate pulmonary hypertension.  Mean transvalvular gradient was 36 mmHg corresponding to aortic valve area calculated 0.88 cm.  Patient was referred to the multidisciplinary heart valve clinic and has been evaluated previously by Dr. Angelena Form.  CT angiography was performed and the patient was referred for surgical consultation.  During the interim period of time the patient has developed hematuria and bladder outlet obstruction with intermittent acute kidney injury.  He has been cared for by a urologist in [facility].  He has failed several voiding trials and now has an indwelling Foley catheter in place.  Long-term anticoagulation using Eliquis has been held due to gross hematuria.  Patient is accompanied for his office consultation visit today by his son who he lives with in [facility].  He has been retired for approximately 20 years.  He admits that he has been experiencing a slow gradual decline in his functional status over the last several years.  He states that this is primarily due to exertional shortness of breath and generalized weakness.  He now ambulates using a walker most of the time.  Up until recently he has still been driving an automobile but he has not been driving for the last 2 months.  He states that he gets tired quite easily with ambulation or any other kind of exertion.  He denies resting shortness of breath, PND, orthopnea, palpitations, dizzy spells, or syncope.  He has never had any chest pain or chest tightness either with activity or at rest.  He has  developed chronic lower extremity edema.  He states that he has a marginal appetite and he has lost probably 25 pounds in weight  over the last year or so.  He feels generalized weakness.  Past Medical History:  Diagnosis Date  . A-fib ([place])   . Acid reflux   . Aortic stenosis    SEVERE  . Cervical disc disorder with radiculopathy    2012 C6 STENOSIS 2' TO BROAD-BASED DISC PROTRUSION  . Chronic systolic (congestive) heart failure ([place])   . CKD (chronic kidney disease) stage 2, GFR 60-89 ml/min   . GERD (gastroesophageal reflux disease)   . HLD (hyperlipidemia)   . Iron deficiency anemia   . Osteopenia   . Prostate cancer ([place])   . Severe aortic stenosis   . Spinal stenosis    RESECTION JAN 2011 LUMBAR SPINE    Past Surgical History:  Procedure Laterality Date  . BACK SURGERY      Family History  Problem Relation Age of Onset  . Heart failure Mother   . Cancer Father     Social History   Socioeconomic History  . Marital status: Widowed    Spouse name: Not on file  . Number of children: 2  . Years of education: Not on file  . Highest education level: Not on file  Occupational History  . Occupation: Retired from [employer]  . Smoking status: Never Smoker  . Smokeless tobacco: Never Used  Substance and Sexual Activity  . Alcohol use: Yes    Comment: social  . Drug use: Never  . Sexual activity: Not on file  Other Topics Concern  . Not on file  Social History Narrative  . Not on file   Social Determinants of Health   Financial Resource Strain:   . Difficulty of Paying Living Expenses:   Food Insecurity:   . Worried About Charity fundraiser in the Last Year:   . Arboriculturist in the Last Year:   Transportation Needs:   . Film/video editor (Medical):   Marland Kitchen Lack of Transportation (Non-Medical):   Physical Activity:   . Days of Exercise per Week:   . Minutes of Exercise per Session:   Stress:   . Feeling of Stress :   Social Connections:   . Frequency of Communication with Friends and Family:   . Frequency of Social Gatherings with Friends and Family:   .  Attends Religious Services:   . Active Member of Clubs or Organizations:   . Attends Archivist Meetings:   Marland Kitchen Marital Status:   Intimate Partner Violence:   . Fear of Current or Ex-Partner:   . Emotionally Abused:   Marland Kitchen Physically Abused:   . Sexually Abused:     Current Outpatient Medications  Medication Sig Dispense Refill  . amiodarone (PACERONE) 200 MG tablet Take 200 mg by mouth daily.    . finasteride (PROSCAR) 5 MG tablet Take 5 mg by mouth daily.    . furosemide (LASIX) 20 MG tablet Take 20 mg by mouth daily.    . Levothyroxine Sodium 25 MCG CAPS Take 25 mcg by mouth daily before breakfast.     . Multiple Vitamin (MULTI-VITAMIN) tablet Take 1 tablet by mouth daily.    . Omega-3 1000 MG CAPS Take 1,000 mg by mouth daily.     . potassium chloride (KLOR-CON) 10 MEQ tablet Take 10 mEq by mouth daily.    Marland Kitchen ELIQUIS 5 MG TABS  tablet Take 5 mg by mouth in the morning and at bedtime. (Patient not taking: Reported on 03/27/2020)    . metoprolol tartrate (LOPRESSOR) 25 MG tablet Take 12.5 mg by mouth 2 (two) times daily.  (Patient not taking: Reported on 03/27/2020)     No current facility-administered medications for this visit.    No Known Allergies    Review of Systems:   General:  decreased appetite, decreased energy, no weight gain, + weight loss, no fever  Cardiac:  no chest pain with exertion, no chest pain at rest, +SOB with exertion, mp resting SOB, mp PND, mp orthopnea, mp palpitations, + arrhythmia, + atrial fibrillation, + LE edema, no dizzy spells, no syncope  Respiratory:  + shortness of breath, no home oxygen, no productive cough, no dry cough, no bronchitis, no wheezing, no hemoptysis, no asthma, no pain with inspiration or cough, no sleep apnea, no CPAP at night  GI:   no difficulty swallowing, no reflux, no frequent heartburn, no hiatal hernia, no abdominal pain, no constipation, no diarrhea, no hematochezia, no hematemesis, no melena  GU:   no dysuria,  +  frequency, no urinary tract infection, + hematuria, + enlarged prostate, no kidney stones, + kidney disease  Vascular:  no pain suggestive of claudication, no pain in feet, no leg cramps, no varicose veins, no DVT, no non-healing foot ulcer  Neuro:   no stroke, no TIA's, no seizures, no headaches, no temporary blindness one eye,  no slurred speech, no peripheral neuropathy, no chronic pain, no instability of gait, mild memory/cognitive dysfunction  Musculoskeletal: no arthritis, no joint swelling, no myalgias, + difficulty walking, limited mobility   Skin:   no rash, no itching, no skin infections, no pressure sores or ulcerations  Psych:   no anxiety, no depression, no nervousness, no unusual recent stress  Eyes:   no blurry vision, no floaters, no recent vision changes, + wears glasses or contacts  ENT:   no hearing loss, no loose or painful teeth, no dentures, last saw dentist March 2021  Hematologic:  + easy bruising, no abnormal bleeding, no clotting disorder, no frequent epistaxis  Endocrine:  no diabetes, does not check CBG's at home           Physical Exam:   BP 93/62   Pulse 72   Temp 97.7 F (36.5 C) (Temporal)   Resp 20   Ht 5\' 8"  (1.727 m)   Wt 138 lb (62.6 kg)   SpO2 96% Comment: RA  BMI 20.98 kg/m   General:  Thin, elderly and somewhat frail-appearing  HEENT:  Unremarkable   Neck:   no JVD, no bruits, no adenopathy   Chest:   clear to auscultation, symmetrical breath sounds, no wheezes, no rhonchi   CV:   Irregular rate and rhythm, grade IV/VI crescendo/decrescendo murmur heard best at LLSB,  no diastolic murmur  Abdomen:  soft, non-tender, no masses   Extremities:  warm, well-perfused, pulses diminished, 2+ bilateral LE edema  Rectal/GU  Deferred  Neuro:   Grossly non-focal and symmetrical throughout  Skin:   Clean and dry, no rashes, no breakdown   Diagnostic Tests:  ECHOCARDIOGRAM  Both report and images from transthoracic echocardiogram performed December 31, 2019 I reviewed.  There is severe global left ventricular systolic dysfunction.  Ejection fraction was estimated 20 to 25%.  Patient is in atrial fibrillation.  There is severe aortic stenosis.  The aortic valve is trileaflet with severe thickening, calcification, and restricted leaflet mobility  involving all 3 leaflets of the aortic valve.  Images with hemodynamic measurements were not captured or available for my personal review but by report the patient had findings consistent with severe low-flow low gradient aortic stenosis.  Report from transthoracic echocardiogram performed several weeks earlier at [facility] revealed very severe aortic stenosis with peak velocity across the aortic valve measured greater than 5.2 m/s.  At that time left ventricular function was felt to be normal.   DIAGNOSTIC CARDIAC CATHETERIZATION  Both images and report from diagnostic cardiac catheterization performed January 06, 2020 at [facility] are reviewed.  Patient has moderate global left ventricular systolic dysfunction with ejection fraction estimated 40 to 45%.  There was normal coronary artery anatomy with no significant coronary artery disease.  Mean transvalvular gradient at catheterization was measured 36 mmHg corresponding to aortic valve area calculated 0.88 cm by Fick calculation.  There was mild to moderate pulmonary hypertension.    Cardiac TAVR CT  TECHNIQUE: The patient was scanned on a [employer]. A 120 kV retrospective scan was triggered in the descending thoracic aorta at 111 HU's. Gantry rotation speed was 250 msecs and collimation was .6 mm. No beta blockade or nitro were given. The 3D data set was reconstructed in 5% intervals of the R-R cycle. Systolic and diastolic phases were analyzed on a dedicated work station using MPR, MIP and VRT modes. The patient received 80 cc of contrast.  FINDINGS: Image quality: Excellent.  Noise artifact  is: Limited.  Valve Morphology: The aortic valve is tricuspid. The tricuspid valve is severely calcified with bulky calcification of the RCC and NCC extending to the base of the leaflets.  Aortic Valve Calcium score: 4715  Aortic annular dimension:  Phase assessed: 20%  Annular area: 495 mm2  Annular perimeter: 80.7 mm  Max diameter: 28.3 mm  Min diameter: 22.6 mm  Annular and subannular calcification: Minimal, spotty annular calcification noted. Minimal LVOT calcification extending from the [place].  Optimal coplanar projection: RAO 23 CAU 59. There is extreme rotation of the cardiac structures due to the anterior course of the aortic arch. This has resulted in extreme counter-clockwise rotation of the aortic root. There is no apparent congenital anomaly.  Coronary Artery Height above Annulus:  Left Main: 19 mm  Right Coronary: 19.3 mm  Sinus of Valsalva Measurements:  Non-coronary: 32 mm  Right-coronary: 32 mm  Left-coronary: 31 mm  Sinus of Valsalva Height:  Non-coronary: 21.3 mm  Right-coronary: 23.1 mm  Left-coronary: 26.9 mm  Sinotubular Junction: 29 mm  Ascending Thoracic Aorta: 34 mm  Coronary Arteries: Normal coronary origin. Right dominance. The study was performed without use of NTG and is insufficient for plaque evaluation. However, there appears to be minimal calcified plaque (<25%) in the LAD and RCA. The LCX is patent.  Cardiac Morphology:  Right Atrium: Right atrial size is within normal limits.  Right Ventricle: The right ventricular cavity is within normal limits.  Left Atrium: Left atrial size is normal in size with no left atrial appendage filling defect.  Left Ventricle: The ventricular cavity size is within normal limits. There are no stigmata of prior infarction. There is no abnormal filling defect. Normal left ventricular function (LVEF=68%). No regional wall motion abnormalities.  Pulmonary  arteries: Normal in size without proximal filling defect.  Pulmonary veins: Normal pulmonary venous drainage.  Pericardium: Normal thickness with no significant effusion or calcium present.  Mitral Valve: The mitral valve is normal structure without significant calcification.  Extra-cardiac findings: See attached radiology report for non-cardiac structures.  IMPRESSION: 1. Annular measurements appropriate for 26 mm Edwards Sapien 3 TAVR.  2. Severe bulky calcifications noted on the RCC/NCC.  3. Minimal annular and subannular calcifications.  4. Sufficient coronary to annulus distance.  5. Optimal Fluoroscopic Angle for Delivery: RAO 23 CAU 59.  6. There is extreme rotation of the cardiac structures due to the anterior course of the aortic arch with very tortuous course. Resultant extreme counter-clockwise rotation of the aortic root noted. There is no apparent congenital anomaly.  Lake Bells T. O'Neal, MD   Electronically Signed   By: Eleonore Chiquito   On: 03/11/2020 17:47   CT CHEST, ABDOMEN AND PELVIS WITHOUT CONTRAST  TECHNIQUE: Multidetector CT imaging of the chest, abdomen and pelvis was performed following the standard protocol without IV contrast.  COMPARISON:  CT of the abdomen and pelvis 02/10/2020.  FINDINGS: CT CHEST FINDINGS  Cardiovascular: Heart size is normal. There is no significant pericardial fluid, thickening or pericardial calcification. There is aortic atherosclerosis, as well as atherosclerosis of the great vessels of the mediastinum and the coronary arteries, including calcified atherosclerotic plaque in the left anterior descending coronary artery. Severe thickening and calcification of the aortic valve.  Mediastinum/Nodes: No pathologically enlarged mediastinal or hilar lymph nodes. Large hiatal hernia. No axillary lymphadenopathy.  Lungs/Pleura: Moderate bilateral pleural effusions with some passive subsegmental  atelectasis in the lungs bilaterally. Atelectasis and/or post infectious or inflammatory scarring in the medial aspect of the left upper lobe. No definite suspicious appearing pulmonary nodules or masses are noted. No acute consolidative airspace disease.  Musculoskeletal: There are no aggressive appearing lytic or blastic lesions noted in the visualized portions of the skeleton.  CT ABDOMEN PELVIS FINDINGS  Hepatobiliary: No suspicious cystic or solid hepatic lesions. No intra or extrahepatic biliary ductal dilatation. Gallbladder is normal in appearance.  Pancreas: Multiple small low-attenuation lesions scattered throughout the body and tail of the pancreas, largest of which is in the distal body measuring 1.3 cm in diameter (axial image 109 of series 15). No peripancreatic fluid collections or inflammatory changes.  Spleen: Unremarkable.  Adrenals/Urinary Tract: 11 mm low-attenuation lesion in the lower pole of the left kidney, compatible with a simple cyst. Right kidney and bilateral adrenal glands are normal in appearance. No hydroureteronephrosis in the abdomen or pelvis. Foley balloon catheter in place within the lumen of the urinary bladder which is nearly decompressed. Mild subjective mural thickening in the urinary bladder which could be accentuated by under distension. Tiny amount of gas non dependently in the lumen of the urinary bladder, presumably iatrogenic.  Stomach/Bowel: Intra-abdominal portion of the stomach is unremarkable. No pathologic dilatation of small bowel or colon. Numerous colonic diverticulae are noted, without surrounding inflammatory changes to suggest an acute diverticulitis at this time. Normal appendix.  Vascular/Lymphatic: Aortic atherosclerosis, with vascular findings and measurements pertinent to potential TAVR procedure, as detailed below. No aneurysm or dissection noted in the abdominal or pelvic vasculature. No lymphadenopathy  noted in the abdomen or pelvis.  Reproductive: Brachytherapy implants throughout the prostate gland. Prostate gland and seminal vesicles are otherwise unremarkable in appearance.  Other: No significant volume of ascites.  No pneumoperitoneum.  Musculoskeletal: Chronic appearing compression fracture of L3 with 70% loss of central vertebral body height. Status post PLIF at L4-L5. There are no aggressive appearing lytic or blastic lesions noted in the visualized portions of the skeleton.  IMPRESSION: 1. Vascular findings and measurements pertinent to potential TAVR procedure, as detailed  above. 2. Severe thickening calcification of the aortic valve, compatible with reported clinical history of severe aortic stenosis. 3. Large hiatal hernia which may have implications for intraprocedural TEE monitoring. 4. Moderate bilateral pleural effusions. 5. Multiple small low-attenuation lesions scattered throughout the body and tail of the pancreas measuring up to 1.3 cm in diameter in the distal body. These are nonspecific, but the possibility of multifocal side branch IPMN (intraductal papillary mucinous neoplasm) should be considered. Given the confluency of these small lesions in the tail of the pancreas, further evaluation with nonemergent abdominal MRI with and without IV gadolinium with MRCP is recommended at this time to better evaluate these findings and establish a baseline for future follow-up examinations. 6. Severe colonic diverticulosis without evidence of acute diverticulitis at this time. 7. Additional incidental findings, as above.   Electronically Signed   By: Vinnie Langton M.D.   On: 03/11/2020 13:45    Impression:  Patient has stage D2 severe low-flow low gradient aortic stenosis.  He currently describes stable symptoms of exertional shortness of breath, fatigue, poor appetite, and lower extremity edema consistent with chronic combined systolic and diastolic  congestive heart failure, New [place] Heart Association functional class III.  Several months ago he was hospitalized at [facility] with class IV symptoms of heart failure in the setting of rapid atrial fibrillation.  He now remains in atrial fibrillation with rate control.  He had been on long-term anticoagulation using Eliquis until recently when it was placed on hold because of gross hematuria.  He has had problems with bladder outlet obstruction with recent episodes of acute kidney injury on top of chronic kidney disease stage II-III.  He has apparently failed multiple voiding trials and now has an indwelling Foley catheter in place.   I have personally reviewed the patient's most recent transthoracic echocardiogram and diagnostic cardiac catheterization, both of which were performed in [facility].  I have also reviewed the patient's recent CT angiograms.  The patient clearly has severe aortic stenosis.  The aortic valve is trileaflet with severely restricted leaflet mobility involving all 3 leaflets of the aortic valve.  By report left ventricular function was normal as recently has the first part of March of this year at which time echocardiogram revealed peak velocity across aortic valve greater than 5 m/s with ejection fraction estimated 65%.  Most recent echocardiogram reveals severe global left ventricular systolic dysfunction and peak velocity across aortic valve measured less than 4 m/s.  Diagnostic cardiac catheterization confirmed the presence of severe aortic stenosis and is notable for the absence of significant coronary artery disease.  There is no question the patient needs aortic valve replacement.  However, I would not consider this elderly and somewhat frail gentleman candidate for conventional surgical aortic valve replacement under any circumstances.  Cardiac-gated CTA of the heart reveals anatomical characteristics consistent with aortic stenosis suitable for treatment  by transcatheter aortic valve replacement without any significant complicating features.  CTA of the aorta and iliac vessels demonstrates adequate caliber vessels involving the descending aorta and iliac vessels, but there is extreme tortuosity of the thoracic aorta with an unusual angle of the heart that is at least in part related to the patient's large hiatal hernia.  This might make an attempt at transcatheter valve replacement via transfemoral approach quite challenging, and I favor use of alternative access via left subclavian approach.    Plan:  The patient and his son were counseled at length regarding treatment alternatives  for management of severe symptomatic aortic stenosis. Alternative approaches such as conventional aortic valve replacement, transcatheter aortic valve replacement, and continued medical therapy without intervention were compared and contrasted at length.  The risks associated with conventional surgical aortic valve replacement were discussed in detail, as were expectations for post-operative convalescence, and why I would be reluctant to consider this patient a candidate for conventional surgery.  Issues specific to transcatheter aortic valve replacement were discussed including questions about long term valve durability, the potential for paravalvular leak, possible increased risk of need for permanent pacemaker placement, and other technical complications related to the procedure itself.  Long-term prognosis with medical therapy was discussed. This discussion was placed in the context of the patient's own specific clinical presentation and past medical history.  The concerning presence of indwelling Foley catheter with associated risk for bladder infection and associated possibility of risk for prosthetic valve endocarditis was discussed.  However, because of the severity of LV dysfunction and aortic stenosis I agree the patient needs to have his aortic stenosis definitively  managed if he is to undergo any type of surgical intervention to deal with his bladder outlet obstruction.  I have encouraged the patient to consider proceeding with close follow-up with his urologist in [facility] once his valve has been replaced with hopes that he will not require long-term indwelling Foley catheter.  All of their questions have been addressed.  The patient desires to proceed with transcatheter aortic valve replacement as soon as possible.  We plan for surgery on 2020/04/23.  Following the decision to proceed with transcatheter aortic valve replacement, a discussion has been held regarding what types of management strategies would be attempted intraoperatively in the event of life-threatening complications, including whether or not the patient would be considered a candidate for the use of cardiopulmonary bypass and/or conversion to open sternotomy for attempted surgical intervention.  The patient specifically requests that should a potentially life-threatening complication develop we would not attempt emergency median sternotomy and/or other aggressive surgical procedures.  The patient has been advised of a variety of complications that might develop including but not limited to risks of death, stroke, paravalvular leak, aortic dissection or other major vascular complications, aortic annulus rupture, device embolization, cardiac rupture or perforation, mitral regurgitation, acute myocardial infarction, arrhythmia, heart block or bradycardia requiring permanent pacemaker placement, congestive heart failure, respiratory failure, renal failure, pneumonia, infection, other late complications related to structural valve deterioration or migration, or other complications that might ultimately cause a temporary or permanent loss of functional independence or other long term morbidity.  The patient provides full informed consent for the procedure as described and all questions were  answered.      I spent in excess of 90 minutes during the conduct of this office consultation and >50% of this time involved direct face-to-face encounter with the patient for counseling and/or coordination of their care.      Valentina Gu. Roxy Manns, MD 03/27/2020 10:10 AM

## 2020-03-27 NOTE — Progress Notes (Signed)
Carotid duplex bilateral study completed.   See Cv Proc for preliminary results.   Hussein Macdougal  

## 2020-03-27 NOTE — Patient Instructions (Signed)
Do not take Eliquis  Continue taking all other medications without change through the day before surgery.  Make sure to bring all of your medications with you when you come for your Pre-Admission Testing appointment at [facility] Short-Stay Department.  Have nothing to eat or drink after midnight the night before surgery.  On the morning of surgery take only Synthroid with a sip of water.  At your appointment for Pre-Admission Testing at the [facility] Short-Stay Department you will be asked to sign permission forms for your upcoming surgery.  By definition your signature on these forms implies that you and/or your designee provide full informed consent for your planned surgical procedure(s), that alternative treatment options have been discussed, that you understand and accept any and all potential risks, and that you have some understanding of what to expect for your post-operative convalescence.  For any major cardiac surgical procedure potential operative risks include but are not limited to at least some risk of death, stroke or other neurologic complication, myocardial infarction, congestive heart failure, respiratory failure, renal failure, bleeding requiring blood transfusion and/or reexploration, irregular heart rhythm, heart block or bradycardia requiring permanent pacemaker, pneumonia, pericardial effusion, pleural effusion, wound infection, pulmonary embolus or other thromboembolic complication, chronic pain, or other complications related to the specific procedure(s) performed.  For transcatheter aortic valve replacement additional risks include but are not limited to risk of paravalvular leak, valve embolization, valve thrombosis, aortic dissection, aortic rupture, ventricular septal defect or perforation, pericardial tamponade, injury of the abdominal aorta or its branches, and/or injury or occlusion of the arteries going to your arms or legs.  Please  call to schedule a follow-up appointment in our office prior to surgery if you have any unresolved questions about your planned surgical procedure, the associated risks, alternative treatment options, and/or expectations for your post-operative recovery.

## 2020-03-27 NOTE — Progress Notes (Signed)
PCP - [facility] Cardiologist -   PPM/ICD - n/a Device Orders -  Rep Notified -   Chest x-ray - 03/27/20 EKG - 03/27/20 Stress Test - unknown ECHO - 01/31/20 Cardiac Cath - 01/31/20  Sleep Study - Patient denies CPAP -   Fasting Blood Sugar - n/a Checks Blood Sugar _____ times a day  Blood Thinner Instructions: Eliquis last dose 03/25/2020 Aspirin Instructions:  ERAS Protcol - n/a PRE-SURGERY Ensure or G2-   COVID TEST- scheduled after PAT 03/27/20   Anesthesia review: yes, cardiac history  Patient denies shortness of breath, fever, cough and chest pain at PAT appointment   All instructions explained to the patient, with a verbal understanding of the material. Patient agrees to go over the instructions while at home for a better understanding. Patient also instructed to self quarantine after being tested for COVID-19. The opportunity to ask questions was provided.

## 2020-03-27 NOTE — Therapy (Addendum)
[place], [place], [address] Phone: [phone]   Fax:  [phone]  Physical Therapy Evaluation  Patient Details  Name: Carlos Manning MRN: [id number] Date of Birth: 14-Jun-1931 Referring Provider (PT): Dr [name]    Encounter Date: 03/27/2020   PT End of Session - 03/27/20 0927    Visit Number 1    Number of Visits 1    Date for PT Re-Evaluation 03/27/20    PT Start Time 0845    PT Stop Time 0915    PT Time Calculation (min) 30 min    Activity Tolerance Patient tolerated treatment well    Behavior During Therapy [facility] for tasks assessed/performed           Past Medical History:  Diagnosis Date  . A-fib ([place])   . Acid reflux   . Aortic stenosis    SEVERE  . Cervical disc disorder with radiculopathy    2012 C6 STENOSIS 2' TO BROAD-BASED DISC PROTRUSION  . Chronic systolic (congestive) heart failure ([place])   . CKD (chronic kidney disease) stage 2, GFR 60-89 ml/min   . GERD (gastroesophageal reflux disease)   . HLD (hyperlipidemia)   . Iron deficiency anemia   . Osteopenia   . Prostate cancer ([place])   . Severe aortic stenosis   . Spinal stenosis    RESECTION JAN 2011 LUMBAR SPINE    Past Surgical History:  Procedure Laterality Date  . BACK SURGERY      There were no vitals filed for this visit.    Subjective Assessment - 03/27/20 0848    Subjective Patient has notced over the past year that he has been having shortness of breath whehn he is walking. He uses a walker outside the house. he is able to walk in the house without a walker.    Pertinent History A-fib, Ostepenia, Lumbar surgery,    Limitations Standing;Walking    Currently in Pain? No/denies              [facility] PT Assessment - 03/27/20 0001      Assessment   Medical Diagnosis Severe Aortic Stenosis     Referring Provider (PT) Dr [name]     Onset Date/Surgical Date --   >1 year    Hand  Dominance Right    Next MD Visit 3 more visits today      Precautions   Precautions None      Restrictions   Weight Bearing Restrictions No      Balance Screen   Has the patient fallen in the past 6 months Yes    How many times? 2   slid out of the bed and lost balance carry an item    Has the patient had a decrease in activity level because of a fear of falling?  Yes    Is the patient reluctant to leave their home because of a fear of falling?  Yes      [place] residence    Additional Comments A few steps to get in       Prior Function   Level of Independence Independent with community mobility with device    Vocation Retired      Associate Professor   Overall Cognitive Status Within Functional Limits for tasks assessed    Attention Focused    Focused Attention Appears intact    Memory Appears intact    Awareness Appears intact  Problem Solving Appears intact      Observation/Other Assessments   Focus on Therapeutic Outcomes (FOTO)  1x visit       Sensation   Light Touch Appears Intact    Additional Comments can have parathesias in the right hand from time to time       Coordination   Gross Motor Movements are Fluid and Coordinated Yes    Fine Motor Movements are Fluid and Coordinated Yes      ROM / Strength   AROM / PROM / Strength AROM;PROM;Strength      AROM   Overall AROM Comments bilateral shoulder flexion to 90 degrees all other UE motions WNL;       Strength   Overall Strength Comments right hip 4+/5; 5/5 hip flexion. Felt some shortness of breath following testing.     Strength Assessment Site Hand    Right/Left hand Right;Left    Right Hand Grip (lbs) 20    Left Hand Grip (lbs) 35            [facility] Pre-Surgical Assessment - 03/27/20 0001    5 Meter Walk Test- trial 1 16 sec    5 Meter Walk Test- trial 2 15 sec.     5 Meter Walk Test- trial 3 15 sec.    5 meter walk test average 15.33 sec    4 Stage Balance Test  tolerated for:  10 sec.    4 Stage Balance Test Position 2    comment unable to come to full tandem     Sit To Stand Test- trial 1 5 sec.    Comment 24 seconds. had to use hands     ADL/IADL Independent with: Bathing;Dressing;Meal prep;Finances    ADL/IADL Needs Assistance with: Valla Leaver work    6 Minute Walk- Baseline yes    BP (mmHg) 94/66    HR (bpm) 70    02 Sat (%RA) 99 %    Modified Borg Scale for Dyspnea 0- Nothing at all    Perceived Rate of Exertion (Borg) 6-    6 Minute Walk Post Test yes    BP (mmHg) 89/70    HR (bpm) 80    02 Sat (%RA) 85 %    Modified Borg Scale for Dyspnea 4- somewhat severe    Perceived Rate of Exertion (Borg) 13- Somewhat hard    Aerobic Endurance Distance Walked 202    Endurance additional comments 85% disability 140' in 2:20 1:45 rest break then an additional 62 feet                     Objective measurements completed on examination: See above findings.               PT Education - 03/27/20 0926    Education Details puose of testing    Person(s) Educated Patient;Child(ren)    Methods Explanation;Demonstration;Tactile cues;Verbal cues    Comprehension Verbalized understanding;Returned demonstration                       Plan - 03/27/20 0928    Clinical Impression Statement See below    Examination-Activity Limitations Locomotion Level;Stand;Stairs    Stability/Clinical Decision Making Stable/Uncomplicated    Clinical Decision Making Low    Rehab Potential Good    PT Frequency One time visit    PT Treatment/Interventions Patient/family education          Clinical Impression Statement: Pt is a 86 yo male  presenting to OP PT for evaluation prior to possible TAVR surgery due to severe aortic stenosis. Pt reports onset of dyspnea with ambualtion approximately >12 months months ago. Symptoms are limiting ability to ambulate distances.. Pt presents with slightly limited shoulder ROM and right hip strength, fair  balance and is at high fall risk 4 stage balance test, decreased walking speed and decreased aerobic endurance per 6 minute walk test. Pt ambulated 140 feet in 2:20 before requesting a seated rest beak lasting 1:45. At time of rest, patient's HR was 80 bpm and O2 was 94 on room air. Pt reported 2/10 shortness of breath on modified scale for dyspnea. Pt able to resume after rest and ambulate an additional 62 feet. Pt ambulated a total of 202 feet in 6 minute walk final Sao2 at 85. Sao2 returned to baseline after a 1 minute rest break. Final dyspnea 4/10. B/P increased significantly with 6 minute walk test. Based on the Short Physical Performance Battery, patient has a frailty rating of 3/12 with </= 5/12 considered frail.    Patient will benefit from skilled therapeutic intervention in order to improve the following deficits and impairments:  Abnormal gait, Decreased endurance  Visit Diagnosis: Other abnormalities of gait and mobility - Plan: PT plan of care cert/re-cert     Problem List There are no problems to display for this patient.   Carney Living 03/27/2020, 10:52 AM  [facility] [address] [place], [place], [address] Phone: [phone]   Fax:  [phone]  Name: Carlos Manning MRN: [id number] Date of Birth: 1930-10-13

## 2020-03-28 LAB — SARS CORONAVIRUS 2 (TAT 6-24 HRS): SARS Coronavirus 2: NEGATIVE

## 2020-03-30 ENCOUNTER — Telehealth: Payer: Self-pay | Admitting: Physician Assistant

## 2020-03-30 MED ORDER — SODIUM CHLORIDE 0.9 % IV SOLN
INTRAVENOUS | Status: DC
  Filled 2020-03-30: qty 30

## 2020-03-30 MED ORDER — NOREPINEPHRINE 4 MG/250ML-% IV SOLN
0.0000 ug/min | INTRAVENOUS | Status: AC
  Administered 2020-03-31: 1 ug/min via INTRAVENOUS
  Filled 2020-03-30: qty 250

## 2020-03-30 MED ORDER — DEXMEDETOMIDINE HCL IN NACL 400 MCG/100ML IV SOLN
0.1000 ug/kg/h | INTRAVENOUS | Status: DC
  Filled 2020-03-30: qty 100

## 2020-03-30 MED ORDER — VANCOMYCIN HCL 1250 MG/250ML IV SOLN
1250.0000 mg | INTRAVENOUS | Status: AC
  Administered 2020-03-31: 1250 mg via INTRAVENOUS
  Filled 2020-03-30: qty 250

## 2020-03-30 MED ORDER — SODIUM CHLORIDE 0.9 % IV SOLN
1.5000 g | INTRAVENOUS | Status: AC
  Administered 2020-03-31: 1.5 g via INTRAVENOUS
  Filled 2020-03-30: qty 1.5

## 2020-03-30 MED ORDER — MAGNESIUM SULFATE 50 % IJ SOLN
40.0000 meq | INTRAMUSCULAR | Status: DC
  Filled 2020-03-30: qty 9.85

## 2020-03-30 MED ORDER — POTASSIUM CHLORIDE 2 MEQ/ML IV SOLN
80.0000 meq | INTRAVENOUS | Status: DC
  Filled 2020-03-30: qty 40

## 2020-03-30 NOTE — Telephone Encounter (Signed)
erro  neous encounter

## 2020-03-31 ENCOUNTER — Other Ambulatory Visit: Payer: Self-pay

## 2020-03-31 ENCOUNTER — Ambulatory Visit (HOSPITAL_COMMUNITY): Payer: Medicare PPO

## 2020-03-31 ENCOUNTER — Inpatient Hospital Stay (HOSPITAL_COMMUNITY): Payer: Medicare PPO | Admitting: Emergency Medicine

## 2020-03-31 ENCOUNTER — Inpatient Hospital Stay (HOSPITAL_COMMUNITY): Payer: Medicare PPO | Admitting: Certified Registered Nurse Anesthetist

## 2020-03-31 ENCOUNTER — Encounter (HOSPITAL_COMMUNITY): Admission: RE | Disposition: E | Payer: Self-pay | Source: Home / Self Care | Attending: Cardiovascular Disease

## 2020-03-31 ENCOUNTER — Inpatient Hospital Stay (HOSPITAL_COMMUNITY)
Admission: RE | Admit: 2020-03-31 | Discharge: 2020-04-09 | DRG: 266 | Disposition: E | Payer: Medicare PPO | Attending: Cardiovascular Disease | Admitting: Cardiovascular Disease

## 2020-03-31 ENCOUNTER — Encounter (HOSPITAL_COMMUNITY): Payer: Self-pay | Admitting: Cardiovascular Disease

## 2020-03-31 DIAGNOSIS — I350 Nonrheumatic aortic (valve) stenosis: Secondary | ICD-10-CM | POA: Diagnosis present

## 2020-03-31 DIAGNOSIS — I5043 Acute on chronic combined systolic (congestive) and diastolic (congestive) heart failure: Secondary | ICD-10-CM | POA: Diagnosis present

## 2020-03-31 DIAGNOSIS — Z006 Encounter for examination for normal comparison and control in clinical research program: Secondary | ICD-10-CM

## 2020-03-31 DIAGNOSIS — D509 Iron deficiency anemia, unspecified: Secondary | ICD-10-CM | POA: Diagnosis present

## 2020-03-31 DIAGNOSIS — E785 Hyperlipidemia, unspecified: Secondary | ICD-10-CM | POA: Diagnosis present

## 2020-03-31 DIAGNOSIS — I314 Cardiac tamponade: Secondary | ICD-10-CM | POA: Diagnosis not present

## 2020-03-31 DIAGNOSIS — Y658 Other specified misadventures during surgical and medical care: Secondary | ICD-10-CM | POA: Diagnosis not present

## 2020-03-31 DIAGNOSIS — Z79899 Other long term (current) drug therapy: Secondary | ICD-10-CM

## 2020-03-31 DIAGNOSIS — I313 Pericardial effusion (noninflammatory): Secondary | ICD-10-CM | POA: Diagnosis not present

## 2020-03-31 DIAGNOSIS — I9751 Accidental puncture and laceration of a circulatory system organ or structure during a circulatory system procedure: Secondary | ICD-10-CM | POA: Diagnosis not present

## 2020-03-31 DIAGNOSIS — N320 Bladder-neck obstruction: Secondary | ICD-10-CM | POA: Diagnosis present

## 2020-03-31 DIAGNOSIS — Z79890 Hormone replacement therapy (postmenopausal): Secondary | ICD-10-CM

## 2020-03-31 DIAGNOSIS — I272 Pulmonary hypertension, unspecified: Secondary | ICD-10-CM | POA: Diagnosis present

## 2020-03-31 DIAGNOSIS — K449 Diaphragmatic hernia without obstruction or gangrene: Secondary | ICD-10-CM | POA: Diagnosis present

## 2020-03-31 DIAGNOSIS — Z952 Presence of prosthetic heart valve: Secondary | ICD-10-CM

## 2020-03-31 DIAGNOSIS — Z8249 Family history of ischemic heart disease and other diseases of the circulatory system: Secondary | ICD-10-CM

## 2020-03-31 DIAGNOSIS — M8580 Other specified disorders of bone density and structure, unspecified site: Secondary | ICD-10-CM | POA: Diagnosis present

## 2020-03-31 DIAGNOSIS — K219 Gastro-esophageal reflux disease without esophagitis: Secondary | ICD-10-CM | POA: Diagnosis present

## 2020-03-31 DIAGNOSIS — N182 Chronic kidney disease, stage 2 (mild): Secondary | ICD-10-CM | POA: Diagnosis present

## 2020-03-31 DIAGNOSIS — Z8546 Personal history of malignant neoplasm of prostate: Secondary | ICD-10-CM | POA: Diagnosis not present

## 2020-03-31 DIAGNOSIS — I4819 Other persistent atrial fibrillation: Secondary | ICD-10-CM | POA: Diagnosis present

## 2020-03-31 DIAGNOSIS — Z960 Presence of urogenital implants: Secondary | ICD-10-CM | POA: Diagnosis present

## 2020-03-31 DIAGNOSIS — Z978 Presence of other specified devices: Secondary | ICD-10-CM

## 2020-03-31 DIAGNOSIS — I428 Other cardiomyopathies: Secondary | ICD-10-CM | POA: Diagnosis present

## 2020-03-31 DIAGNOSIS — R413 Other amnesia: Secondary | ICD-10-CM | POA: Diagnosis present

## 2020-03-31 HISTORY — DX: Presence of prosthetic heart valve: Z95.2

## 2020-03-31 HISTORY — PX: TEE WITHOUT CARDIOVERSION: SHX5443

## 2020-03-31 HISTORY — DX: Other persistent atrial fibrillation: I48.19

## 2020-03-31 HISTORY — DX: Presence of other specified devices: Z97.8

## 2020-03-31 HISTORY — DX: Personal history of malignant neoplasm of prostate: Z85.46

## 2020-03-31 LAB — POCT I-STAT, CHEM 8
BUN: 24 mg/dL — ABNORMAL HIGH (ref 8–23)
BUN: 24 mg/dL — ABNORMAL HIGH (ref 8–23)
Calcium, Ion: 1.21 mmol/L (ref 1.15–1.40)
Calcium, Ion: 1.18 mmol/L (ref 1.15–1.40)
Chloride: 104 mmol/L (ref 98–111)
Chloride: 104 mmol/L (ref 98–111)
Creatinine, Ser: 0.8 mg/dL (ref 0.61–1.24)
Creatinine, Ser: 0.8 mg/dL (ref 0.61–1.24)
Glucose, Bld: 90 mg/dL (ref 70–99)
Glucose, Bld: 101 mg/dL — ABNORMAL HIGH (ref 70–99)
HCT: 27 % — ABNORMAL LOW (ref 39.0–52.0)
HCT: 22 % — ABNORMAL LOW (ref 39.0–52.0)
Hemoglobin: 9.2 g/dL — ABNORMAL LOW (ref 13.0–17.0)
Hemoglobin: 7.5 g/dL — ABNORMAL LOW (ref 13.0–17.0)
Potassium: 3.6 mmol/L (ref 3.5–5.1)
Potassium: 3.5 mmol/L (ref 3.5–5.1)
Sodium: 142 mmol/L (ref 135–145)
Sodium: 140 mmol/L (ref 135–145)
TCO2: 27 mmol/L (ref 22–32)
TCO2: 26 mmol/L (ref 22–32)

## 2020-03-31 LAB — POCT I-STAT 7, (LYTES, BLD GAS, ICA,H+H)
Acid-Base Excess: 0 mmol/L (ref 0.0–2.0)
Acid-base deficit: 8 mmol/L — ABNORMAL HIGH (ref 0.0–2.0)
Bicarbonate: 20.3 mmol/L (ref 20.0–28.0)
Bicarbonate: 26.7 mmol/L (ref 20.0–28.0)
Calcium, Ion: 1.06 mmol/L — ABNORMAL LOW (ref 1.15–1.40)
Calcium, Ion: 1.09 mmol/L — ABNORMAL LOW (ref 1.15–1.40)
HCT: 26 % — ABNORMAL LOW (ref 39.0–52.0)
HCT: 30 % — ABNORMAL LOW (ref 39.0–52.0)
Hemoglobin: 10.2 g/dL — ABNORMAL LOW (ref 13.0–17.0)
Hemoglobin: 8.8 g/dL — ABNORMAL LOW (ref 13.0–17.0)
O2 Saturation: 100 %
O2 Saturation: 100 %
Potassium: 3.3 mmol/L — ABNORMAL LOW (ref 3.5–5.1)
Potassium: 4.1 mmol/L (ref 3.5–5.1)
Sodium: 142 mmol/L (ref 135–145)
Sodium: 144 mmol/L (ref 135–145)
TCO2: 22 mmol/L (ref 22–32)
TCO2: 28 mmol/L (ref 22–32)
pCO2 arterial: 51.9 mmHg — ABNORMAL HIGH (ref 32.0–48.0)
pCO2 arterial: 53.6 mmHg — ABNORMAL HIGH (ref 32.0–48.0)
pH, Arterial: 7.201 — ABNORMAL LOW (ref 7.350–7.450)
pH, Arterial: 7.306 — ABNORMAL LOW (ref 7.350–7.450)
pO2, Arterial: 498 mmHg — ABNORMAL HIGH (ref 83.0–108.0)
pO2, Arterial: 500 mmHg — ABNORMAL HIGH (ref 83.0–108.0)

## 2020-03-31 LAB — PREPARE RBC (CROSSMATCH)

## 2020-03-31 LAB — PROTIME-INR
INR: 1.2 (ref 0.8–1.2)
Prothrombin Time: 14.3 seconds (ref 11.4–15.2)

## 2020-03-31 SURGERY — IMPLANTATION, AORTIC VALVE, TRANSCATHETER, SUBCLAVIAN ARTERY APPROACH
Anesthesia: General | Site: Chest

## 2020-03-31 MED ORDER — CHLORHEXIDINE GLUCONATE 0.12 % MT SOLN
15.0000 mL | Freq: Once | OROMUCOSAL | Status: AC
  Administered 2020-03-31: 15 mL via OROMUCOSAL
  Filled 2020-03-31: qty 15

## 2020-03-31 MED ORDER — NOREPINEPHRINE BITARTRATE 1 MG/ML IV SOLN: INTRAVENOUS | Status: DC | PRN

## 2020-03-31 MED ORDER — PROTAMINE SULFATE 10 MG/ML IV SOLN
INTRAVENOUS | Status: AC
  Filled 2020-03-31: qty 5

## 2020-03-31 MED ORDER — EPINEPHRINE 1 MG/10ML IJ SOSY
PREFILLED_SYRINGE | INTRAMUSCULAR | Status: DC | PRN
Start: 2020-03-31 — End: 2020-03-31
  Administered 2020-03-31: .4 mg via INTRAVENOUS
  Administered 2020-03-31 (×4): 1 mg via INTRAVENOUS
  Administered 2020-03-31: .2 mg via INTRAVENOUS
  Administered 2020-03-31: .4 mg via INTRAVENOUS

## 2020-03-31 MED ORDER — 0.9 % SODIUM CHLORIDE (POUR BTL) OPTIME
TOPICAL | Status: DC | PRN
  Administered 2020-03-31: 1000 mL

## 2020-03-31 MED ORDER — EPINEPHRINE HCL 5 MG/250ML IV SOLN IN NS
0.5000 ug/min | INTRAVENOUS | Status: DC
  Administered 2020-03-31 (×2): 5 ug/min via INTRAVENOUS
  Administered 2020-03-31: 10 ug/min via INTRAVENOUS
  Filled 2020-03-31: qty 250

## 2020-03-31 MED ORDER — PROPOFOL 500 MG/50ML IV EMUL
INTRAVENOUS | Status: DC | PRN
Start: 2020-03-31 — End: 2020-03-31
  Administered 2020-03-31: 60 mg via INTRAVENOUS

## 2020-03-31 MED ORDER — ETOMIDATE 2 MG/ML IV SOLN
INTRAVENOUS | Status: AC
  Filled 2020-03-31: qty 10

## 2020-03-31 MED ORDER — LIDOCAINE HCL (PF) 1 % IJ SOLN
INTRAMUSCULAR | Status: AC
  Filled 2020-03-31: qty 30

## 2020-03-31 MED ORDER — NOREPINEPHRINE BITARTRATE 1 MG/ML IV SOLN
INTRAVENOUS | Status: DC | PRN
  Administered 2020-03-31: 1 mL via INTRAVENOUS
  Administered 2020-03-31 (×2): .5 mL via INTRAVENOUS

## 2020-03-31 MED ORDER — SODIUM CHLORIDE 0.9 % IV SOLN: INTRAVENOUS | Status: DC

## 2020-03-31 MED ORDER — MIDAZOLAM HCL 2 MG/2ML IJ SOLN
INTRAMUSCULAR | Status: AC
  Filled 2020-03-31: qty 4

## 2020-03-31 MED ORDER — IODIXANOL 320 MG/ML IV SOLN
INTRAVENOUS | Status: DC | PRN
  Administered 2020-03-31: 150 mL
  Administered 2020-03-31: 50 mL

## 2020-03-31 MED ORDER — CHLORHEXIDINE GLUCONATE 4 % EX LIQD: 30.0000 mL | CUTANEOUS | Status: DC

## 2020-03-31 MED ORDER — SODIUM CHLORIDE 0.9 % IV SOLN
INTRAVENOUS | Status: AC
  Filled 2020-03-31 (×3): qty 1.2

## 2020-03-31 MED ORDER — SODIUM BICARBONATE 8.4 % IV SOLN
INTRAVENOUS | Status: DC | PRN
  Administered 2020-03-31: 50 meq via INTRAVENOUS

## 2020-03-31 MED ORDER — SODIUM CHLORIDE (PF) 0.9 % IJ SOLN
OROMUCOSAL | Status: DC | PRN
  Administered 2020-03-31 (×3): 4 mL via TOPICAL

## 2020-03-31 MED ORDER — DEXAMETHASONE SODIUM PHOSPHATE 10 MG/ML IJ SOLN
INTRAMUSCULAR | Status: DC | PRN
  Administered 2020-03-31: 4 mg via INTRAVENOUS

## 2020-03-31 MED ORDER — PROTAMINE SULFATE 10 MG/ML IV SOLN
INTRAVENOUS | Status: DC | PRN
Start: 2020-03-31 — End: 2020-03-31
  Administered 2020-03-31: 90 mg via INTRAVENOUS

## 2020-03-31 MED ORDER — ETOMIDATE 2 MG/ML IV SOLN
INTRAVENOUS | Status: DC | PRN
Start: 2020-03-31 — End: 2020-03-31
  Administered 2020-03-31: 16 mg via INTRAVENOUS

## 2020-03-31 MED ORDER — ALBUMIN HUMAN 5 % IV SOLN
INTRAVENOUS | Status: DC | PRN
Start: 2020-03-31 — End: 2020-03-31

## 2020-03-31 MED ORDER — SODIUM CHLORIDE 0.9 % IV SOLN
INTRAVENOUS | Status: DC | PRN
  Administered 2020-03-31: 1500 mL

## 2020-03-31 MED ORDER — MIDAZOLAM HCL 5 MG/5ML IJ SOLN
INTRAMUSCULAR | Status: DC | PRN
Start: 2020-03-31 — End: 2020-03-31
  Administered 2020-03-31: 4 mg via INTRAVENOUS

## 2020-03-31 MED ORDER — ROCURONIUM BROMIDE 10 MG/ML (PF) SYRINGE
PREFILLED_SYRINGE | INTRAVENOUS | Status: DC | PRN
  Administered 2020-03-31: 20 mg via INTRAVENOUS
  Administered 2020-03-31: 70 mg via INTRAVENOUS

## 2020-03-31 MED ORDER — CHLORHEXIDINE GLUCONATE 4 % EX LIQD: 60.0000 mL | Freq: Once | CUTANEOUS | Status: DC

## 2020-03-31 MED ORDER — SODIUM CHLORIDE 0.9 % IV SOLN: 10.0000 mL/h | Freq: Once | INTRAVENOUS | Status: DC

## 2020-03-31 MED ORDER — CALCIUM CHLORIDE 10 % IV SOLN
INTRAVENOUS | Status: DC | PRN
Start: 2020-03-31 — End: 2020-03-31
  Administered 2020-03-31 (×4): 500 mg via INTRAVENOUS

## 2020-03-31 MED ORDER — LIDOCAINE 2% (20 MG/ML) 5 ML SYRINGE
INTRAMUSCULAR | Status: DC | PRN
  Administered 2020-03-31: 30 mg via INTRAVENOUS

## 2020-03-31 MED ORDER — FENTANYL CITRATE (PF) 100 MCG/2ML IJ SOLN
INTRAMUSCULAR | Status: DC | PRN
  Administered 2020-03-31 (×2): 50 ug via INTRAVENOUS

## 2020-03-31 MED ORDER — LACTATED RINGERS IV SOLN: INTRAVENOUS | Status: DC

## 2020-03-31 MED ORDER — FENTANYL CITRATE (PF) 250 MCG/5ML IJ SOLN
INTRAMUSCULAR | Status: AC
  Filled 2020-03-31: qty 5

## 2020-03-31 MED ORDER — HEPARIN SODIUM (PORCINE) 1000 UNIT/ML IJ SOLN
INTRAMUSCULAR | Status: DC | PRN
  Administered 2020-03-31: 9000 [IU] via INTRAVENOUS

## 2020-03-31 MED ORDER — HEPARIN SODIUM (PORCINE) 1000 UNIT/ML IJ SOLN
INTRAMUSCULAR | Status: AC
  Filled 2020-03-31: qty 2

## 2020-03-31 SURGICAL SUPPLY — 113 items
BAG DECANTER FOR FLEXI CONT (MISCELLANEOUS) ×4 IMPLANT
BAG SNAP BAND KOVER 36X36 (MISCELLANEOUS) ×4 IMPLANT
BLADE CLIPPER SURG (BLADE) IMPLANT
BLADE OSCILLATING /SAGITTAL (BLADE) IMPLANT
BLADE STERNUM SYSTEM 6 (BLADE) IMPLANT
CABLE ADAPT CONN TEMP 6FT (ADAPTER) ×4 IMPLANT
CABLE PACING FASLOC BIEGE (MISCELLANEOUS) ×4 IMPLANT
CABLE PACING FASLOC BLUE (MISCELLANEOUS) ×4 IMPLANT
CANNULA FEM VENOUS REMOTE 22FR (CANNULA) IMPLANT
CANNULA OPTISITE PERFUSION 16F (CANNULA) IMPLANT
CANNULA OPTISITE PERFUSION 18F (CANNULA) IMPLANT
CATH DIAG EXPO 6F AL1 (CATHETERS) IMPLANT
CATH DIAG EXPO 6F FR4 (CATHETERS) ×4 IMPLANT
CATH DIAG EXPO 6F VENT PIG 145 (CATHETERS) ×8 IMPLANT
CATH EXTERNAL FEMALE PUREWICK (CATHETERS) IMPLANT
CATH INFINITI 6F AL2 (CATHETERS) ×4 IMPLANT
CATH S G BIP PACING (CATHETERS) ×4 IMPLANT
CLIP VESOCCLUDE MED 24/CT (CLIP) ×4 IMPLANT
CLIP VESOCCLUDE SM WIDE 24/CT (CLIP) ×4 IMPLANT
CLOSURE MYNX CONTROL 6F/7F (Vascular Products) ×4 IMPLANT
CNTNR URN SCR LID CUP LEK RST (MISCELLANEOUS) ×4 IMPLANT
CONN 1/4X1/4 STERILE (MISCELLANEOUS) ×4 IMPLANT
CONN ST 1/4X3/8  BEN (MISCELLANEOUS) ×2
CONN ST 1/4X3/8 BEN (MISCELLANEOUS) ×2 IMPLANT
CONT SPEC 4OZ STRL OR WHT (MISCELLANEOUS) ×4
COVER BACK TABLE 80X110 HD (DRAPES) ×4 IMPLANT
COVER DOME SNAP 22 D (MISCELLANEOUS) IMPLANT
COVER WAND RF STERILE (DRAPES) IMPLANT
DERMABOND ADVANCED (GAUZE/BANDAGES/DRESSINGS) ×2
DERMABOND ADVANCED .7 DNX12 (GAUZE/BANDAGES/DRESSINGS) ×2 IMPLANT
DRAIN CHANNEL 32F RND 10.7 FF (WOUND CARE) ×4 IMPLANT
DRAPE INCISE IOBAN 66X45 STRL (DRAPES) ×4 IMPLANT
DRSG AQUACEL AG ADV 3.5X 6 (GAUZE/BANDAGES/DRESSINGS) ×4 IMPLANT
DRSG TEGADERM 4X4.75 (GAUZE/BANDAGES/DRESSINGS) ×4 IMPLANT
ELECT BLADE 4.0 EZ CLEAN MEGAD (MISCELLANEOUS) ×4
ELECT BLADE 6.5 EXT (BLADE) ×4 IMPLANT
ELECT CAUTERY BLADE 6.4 (BLADE) IMPLANT
ELECT REM PT RETURN 9FT ADLT (ELECTROSURGICAL) ×4
ELECTRODE BLDE 4.0 EZ CLN MEGD (MISCELLANEOUS) ×2 IMPLANT
ELECTRODE REM PT RTRN 9FT ADLT (ELECTROSURGICAL) ×2 IMPLANT
FELT TEFLON 1X6 (MISCELLANEOUS) ×4 IMPLANT
FELT TEFLON 6X6 (MISCELLANEOUS) ×4 IMPLANT
FEMORAL VENOUS CANN RAP (CANNULA) IMPLANT
GAUZE SPONGE 4X4 12PLY STRL (GAUZE/BANDAGES/DRESSINGS) ×4 IMPLANT
GLOVE BIO SURGEON STRL SZ7.5 (GLOVE) ×4 IMPLANT
GLOVE BIO SURGEON STRL SZ8 (GLOVE) IMPLANT
GLOVE EUDERMIC 7 POWDERFREE (GLOVE) IMPLANT
GLOVE ORTHO TXT STRL SZ7.5 (GLOVE) ×4 IMPLANT
GOWN STRL REUS W/ TWL LRG LVL3 (GOWN DISPOSABLE) ×4 IMPLANT
GOWN STRL REUS W/ TWL XL LVL3 (GOWN DISPOSABLE) ×6 IMPLANT
GOWN STRL REUS W/TWL LRG LVL3 (GOWN DISPOSABLE) ×4
GOWN STRL REUS W/TWL XL LVL3 (GOWN DISPOSABLE) ×6
GUIDEWIRE SAFE TJ AMPLATZ EXST (WIRE) ×4 IMPLANT
HEMOSTAT POWDER SURGIFOAM 1G (HEMOSTASIS) ×12 IMPLANT
INSERT FOGARTY SM (MISCELLANEOUS) IMPLANT
KIT BASIN OR (CUSTOM PROCEDURE TRAY) ×4 IMPLANT
KIT DILATOR VASC 18G NDL (KITS) IMPLANT
KIT HEART LEFT (KITS) ×4 IMPLANT
KIT SUCTION CATH 14FR (SUCTIONS) ×4 IMPLANT
KIT TURNOVER KIT B (KITS) ×4 IMPLANT
LEAD PACING MYOCARDI (MISCELLANEOUS) ×4 IMPLANT
LOOP VESSEL MAXI BLUE (MISCELLANEOUS) ×4 IMPLANT
LOOP VESSEL MINI RED (MISCELLANEOUS) ×4 IMPLANT
NEEDLE 22X1 1/2 (OR ONLY) (NEEDLE) IMPLANT
NEEDLE PERC 18GX7CM (NEEDLE) ×4 IMPLANT
NS IRRIG 1000ML POUR BTL (IV SOLUTION) ×8 IMPLANT
PACK ENDOVASCULAR (PACKS) ×4 IMPLANT
PAD ARMBOARD 7.5X6 YLW CONV (MISCELLANEOUS) ×8 IMPLANT
PAD ELECT DEFIB RADIOL ZOLL (MISCELLANEOUS) ×4 IMPLANT
PENCIL BUTTON HOLSTER BLD 10FT (ELECTRODE) ×4 IMPLANT
POSITIONER HEAD DONUT 9IN (MISCELLANEOUS) ×4 IMPLANT
SET CANNULATION TOURNIQUET (MISCELLANEOUS) ×4 IMPLANT
SHEATH BRITE TIP 7FR 35CM (SHEATH) ×4 IMPLANT
SHEATH PINNACLE 6F 10CM (SHEATH) ×4 IMPLANT
SHEATH PINNACLE 8F 10CM (SHEATH) ×4 IMPLANT
SLEEVE REPOSITIONING LENGTH 30 (MISCELLANEOUS) ×4 IMPLANT
SPONGE LAP 4X18 RFD (DISPOSABLE) ×4 IMPLANT
STOPCOCK MORSE 400PSI 3WAY (MISCELLANEOUS) ×12 IMPLANT
SUT BONE WAX W31G (SUTURE) ×4 IMPLANT
SUT ETHIBOND 2 0 SH (SUTURE) ×2
SUT ETHIBOND 2 0 SH 36X2 (SUTURE) ×2 IMPLANT
SUT ETHIBOND NAB MH 2-0 36IN (SUTURE) ×8 IMPLANT
SUT ETHIBOND X763 2 0 SH 1 (SUTURE) ×8 IMPLANT
SUT GORETEX CV 4 TH 22 36 (SUTURE) ×4 IMPLANT
SUT GORETEX CV4 TH-18 (SUTURE) ×8 IMPLANT
SUT MNCRL AB 3-0 PS2 18 (SUTURE) ×8 IMPLANT
SUT PROLENE 1 XLH (SUTURE) ×4 IMPLANT
SUT PROLENE 1 XLH 60 (SUTURE) ×4 IMPLANT
SUT PROLENE 3 0 SH 1 (SUTURE) ×4 IMPLANT
SUT PROLENE 4 0 SH DA (SUTURE) ×12 IMPLANT
SUT PROLENE 5 0 C 1 36 (SUTURE) ×4 IMPLANT
SUT PROLENE 6 0 C 1 30 (SUTURE) ×8 IMPLANT
SUT SILK  1 MH (SUTURE) ×4
SUT SILK 1 MH (SUTURE) ×4 IMPLANT
SUT SILK 2 0 SH CR/8 (SUTURE) ×4 IMPLANT
SUT TEM PAC WIRE 2 0 SH (SUTURE) ×8 IMPLANT
SUT VIC AB 2-0 CT1 27 (SUTURE) ×2
SUT VIC AB 2-0 CT1 TAPERPNT 27 (SUTURE) ×2 IMPLANT
SUT VIC AB 2-0 CTX 36 (SUTURE) ×4 IMPLANT
SUT VIC AB 3-0 SH 8-18 (SUTURE) ×8 IMPLANT
SYR 50ML LL SCALE MARK (SYRINGE) ×4 IMPLANT
SYR BULB IRRIG 60ML STRL (SYRINGE) ×4 IMPLANT
SYR CONTROL 10ML LL (SYRINGE) IMPLANT
SYSTEM SAHARA CHEST DRAIN ATS (WOUND CARE) ×4 IMPLANT
TOWEL GREEN STERILE (TOWEL DISPOSABLE) ×4 IMPLANT
TOWEL GREEN STERILE FF (TOWEL DISPOSABLE) ×4 IMPLANT
TRANSDUCER W/STOPCOCK (MISCELLANEOUS) ×8 IMPLANT
TRAY FOLEY SLVR 16FR TEMP STAT (SET/KITS/TRAYS/PACK) IMPLANT
VALVE 26 ULTRA SAPIEN KIT (Valve) ×4 IMPLANT
WIRE EMERALD 3MM-J .035X150CM (WIRE) ×4 IMPLANT
WIRE EMERALD 3MM-J .035X260CM (WIRE) ×4 IMPLANT
WIRE EMERALD ST .035X260CM (WIRE) ×4 IMPLANT
YANKAUER SUCT BULB TIP NO VENT (SUCTIONS) ×4 IMPLANT

## 2020-04-01 ENCOUNTER — Encounter (HOSPITAL_COMMUNITY): Payer: Self-pay | Admitting: Cardiovascular Disease

## 2020-04-01 MED FILL — Magnesium Sulfate Inj 50%: INTRAMUSCULAR | Qty: 10 | Status: AC

## 2020-04-01 MED FILL — Heparin Sodium (Porcine) Inj 1000 Unit/ML: INTRAMUSCULAR | Qty: 30 | Status: AC

## 2020-04-01 MED FILL — Potassium Chloride Inj 2 mEq/ML: INTRAVENOUS | Qty: 40 | Status: AC

## 2020-04-01 NOTE — Addendum Note (Signed)
Addendum  created 04/01/20 1108 by Josephine Igo, CRNA   Order list changed

## 2020-04-02 ENCOUNTER — Encounter: Payer: Self-pay | Admitting: Cardiology

## 2020-04-02 ENCOUNTER — Encounter: Payer: Self-pay | Admitting: General Practice

## 2020-04-02 LAB — TYPE AND SCREEN
ABO/RH(D): AB POS
Antibody Screen: NEGATIVE
Unit division: 0
Unit division: 0
Unit division: 0
Unit division: 0
Unit division: 0
Unit division: 0
Unit division: 0
Unit division: 0

## 2020-04-02 LAB — BPAM RBC
Blood Product Expiration Date: 202107212359
Blood Product Expiration Date: 202107212359
Blood Product Expiration Date: 202107212359
Blood Product Expiration Date: 202107212359
Blood Product Expiration Date: 202107212359
Blood Product Expiration Date: 202107212359
Blood Product Expiration Date: 202107212359
Blood Product Expiration Date: 202107212359
ISSUE DATE / TIME: 202106221320
ISSUE DATE / TIME: 202106221320
ISSUE DATE / TIME: 202106221320
ISSUE DATE / TIME: 202106221320
ISSUE DATE / TIME: 202106221402
ISSUE DATE / TIME: 202106221402
ISSUE DATE / TIME: 202106221402
ISSUE DATE / TIME: 202106221402
Unit Type and Rh: 6200
Unit Type and Rh: 6200
Unit Type and Rh: 6200
Unit Type and Rh: 6200
Unit Type and Rh: 6200
Unit Type and Rh: 6200
Unit Type and Rh: 6200
Unit Type and Rh: 6200

## 2020-04-09 NOTE — Anesthesia Postprocedure Evaluation (Signed)
Anesthesia Post Note  Patient: Carlos Manning  Procedure(s) Performed: TRANSCATHETER AORTIC VALVE REPLACEMENT, SUBCLAVIAN (N/A Chest) TRANSESOPHAGEAL ECHOCARDIOGRAM (TEE) (N/A )     Anesthesia Type: General Anesthetic complications: yes Comments: Patient expired in OR. See progress notes.   No complications documented.  Last Vitals:  Vitals:   04/10/20 1402 2020/04/10 1407  BP:    Pulse: 99 80  Resp:    Temp:    SpO2:      Last Pain:  Vitals:   04/10/20 0913  TempSrc:   PainSc: 0-No pain                 Ladonte Verstraete COKER

## 2020-04-09 NOTE — Anesthesia Preprocedure Evaluation (Signed)
Anesthesia Evaluation  Patient identified by MRN, date of birth, ID band Patient awake    Reviewed: Allergy & Precautions, NPO status , Patient's Chart, lab work & pertinent test results  Airway Mallampati: II  TM Distance: >3 FB Neck ROM: Full    Dental  (+) Teeth Intact, Dental Advisory Given   Pulmonary    breath sounds clear to auscultation       Cardiovascular  Rhythm:Irregular Rate:Normal + Systolic murmurs    Neuro/Psych    GI/Hepatic   Endo/Other    Renal/GU      Musculoskeletal   Abdominal   Peds  Hematology   Anesthesia Other Findings   Reproductive/Obstetrics                             Anesthesia Physical Anesthesia Plan  ASA: III  Anesthesia Plan: General   Post-op Pain Management:    Induction: Intravenous  PONV Risk Score and Plan: Ondansetron and Dexamethasone  Airway Management Planned: Oral ETT  Additional Equipment: Arterial line  Intra-op Plan:   Post-operative Plan:   Informed Consent: I have reviewed the patients History and Physical, chart, labs and discussed the procedure including the risks, benefits and alternatives for the proposed anesthesia with the patient or authorized representative who has indicated his/her understanding and acceptance.     Dental advisory given  Plan Discussed with: CRNA and Anesthesiologist  Anesthesia Plan Comments:         Anesthesia Quick Evaluation

## 2020-04-09 NOTE — Anesthesia Procedure Notes (Signed)
Arterial Line Insertion Start/End07-07-21 9:55 AM, 04-15-20 10:00 AM Performed by: Roberts Gaudy, MD, Inda Coke, CRNA, CRNA  Patient location: Pre-op. Preanesthetic checklist: patient identified, IV checked, site marked, risks and benefits discussed, surgical consent, monitors and equipment checked, pre-op evaluation, timeout performed and anesthesia consent Lidocaine 1% used for infiltration Right, radial was placed Catheter size: 20 G Hand hygiene performed , maximum sterile barriers used  and Seldinger technique used  Attempts: 1 Procedure performed without using ultrasound guided technique. Following insertion, dressing applied and Biopatch. Post procedure assessment: normal and unchanged  Patient tolerated the procedure well with no immediate complications. Additional procedure comments: SRNA Doretha Sou inserted.

## 2020-04-09 NOTE — Death Summary Note (Addendum)
[facility] VALVE TEAM  Discharge Summary    Patient ID: Carlos Manning MRN: [id number]; DOB: 04-25-1931  Admit date: Apr 06, 2020 Discharge date: 04/06/2020  Primary Care Provider: [facility] [place]  Primary Cardiologist: Roque Cash  Discharge Diagnoses    Principal Problem:   S/P TAVR (transcatheter aortic valve replacement) Active Problems:   Severe aortic stenosis   Persistent atrial fibrillation (HCC)   Acute on chronic combined systolic and diastolic CHF (congestive heart failure) (HCC)   CKD (chronic kidney disease) stage 2, GFR 60-89 ml/min   GERD (gastroesophageal reflux disease)   HLD (hyperlipidemia)   Iron deficiency anemia   History of prostate cancer   Chronic indwelling Foley catheter   Allergies No Known Allergies  Diagnostic Studies/Procedures    [facility]  TAVR OPERATIVE NOTE     Date of Procedure:                2020/04/06   Preoperative Diagnosis:      Severe Aortic Stenosis    Postoperative Diagnosis:    Same     Procedure:        Transcatheter Aortic Valve Replacement - Left Trans-Subclavian Approach             Edwards Sapien 3 Ultra THV (size 26 mm, model # 9750TFX, serial # [id number])   Subxyphoid Pericardial Window for evacuation of pericardial tamponade   Median Sternotomy for attempted repair of left ventricular rupture   Co-Surgeons:                        Valentina Gu. Roxy Manns, MD and [name], MD  Anesthesiologist:                  Roberts Gaudy, MD  Echocardiographer:              Ena Dawley, MD  Pre-operative Echo Findings: ? Critical aortic stenosis ? Normal left ventricular systolic function  Post-operative Findings: ? Trivial paravalvular leak  Hemopericardium with acute pericardial tamponade  Catastrophic left ventricular rupture    _____________     History of Present Illness     Carlos Manning is a 86 y.o. male with a  history of persistent atrial fibrillation on Eliquis, LBBB, GERD, HLD, CKD stage 2, chronic systolic CHF, non-ischemic cardiomyopathy, prostate cancer, iron deficiency anemia, mild memory issues, hematuria and urinary retention with a foley cath in place and severe LFLG aortic stenosis who presented to [facility] on 04-06-20 for planned TAVR.  Patient does not recall how long he has known of the presence of a heart murmur but he has been followed for several years by Roque Cash at [facility] with known history of aortic stenosis. Echocardiogram performed December 10, 2019 at [facility] revealed what was reported to be normal left ventricular systolic function with very severe aortic stenosis with peak velocity across aortic valve measured 5.2 m/s corresponding to peak and mean transvalvular gradients estimated 110 and 72 mmHg respectively and aortic valve area calculated 0.3 cm.  Shortly after that the patient was admitted to [facility] with severe generalized weakness and resting shortness of breath with new onset rapid atrial fibrillation. Atrial fibrillation was treated with rate control and he was started on Eliquis for long-term anticoagulation. He responded to intravenous diuretic therapy and antibiotics.  Echocardiogram performed December 31, 2019 was reported to demonstrate severe left ventricular systolic dysfunction with  ejection fraction estimated only 20 to 25%. There were findings consistent with severe low-flow low gradient aortic stenosis.  Diagnostic cardiac catheterization was performed January 06, 2020 and revealed left ventricular systolic dysfunction with ejection fraction estimated 40 to 45%.  The patient had normal coronary artery anatomy with no significant coronary artery disease.  There was mild to moderate pulmonary hypertension. Mean transvalvular gradient was 36 mmHg corresponding to aortic valve area calculated 0.88 cm.  Patient was referred to the  multidisciplinary heart valve clinic and has been evaluated previously by Dr. Angelena Form. CT angiography was performed and the patient was referred for surgical consultation.  During the interim period of time the patient has developed hematuria and bladder outlet obstruction with intermittent acute kidney injury. He has been cared for by a urologist in [facility]. He has failed several voiding trials and had an indwelling Foley catheter in place. Long-term anticoagulation using Eliquis has been held due to gross hematuria.  The patient has been evaluated by the multidisciplinary valve team and felt to have severe, symptomatic aortic stenosis and to be a suitable candidate for TAVR via the left subclavian approach due to extreme rotation of the cardiac structures due to the anterior course of the aortic arch with very tortuous course. TAVR was set up for 04-28-20.    Hospital Course     Consultants: Chaplain   The patient was brought to the operating room for transcatheter aortic valve replacement via the subclavian approach.   Shortly after balloon aortic valvuloplasty the patient suffered sudden hemodynamic collapse.  TEE revealed new hemopericardium consistent with pericardial tamponade.  Hemopericardium was suspected to be related to right ventricular injury from temporary transvenous pacing wire.  Subxiphoid pericardial window was performed and initially the patient's condition improved.  Once the patient's hemodynamics stabilized valve deployment was performed without complication.  The sheath was removed and the subclavian artery repaired, after which time protamine was administered.  Shortly after that the patient again developed hypotension.  Reexploration of the subxiphoid wound revealed continued bleeding despite reversal of heparin with protamine.  Emergency median sternotomy was performed.  Exploration of the mediastinum revealed left ventricular rupture likely related to endovascular injury from  either the Amplatz extra-stiff wire utilized for valve deployment or the straight wire utilized at the time of the aortic valve was originally crossed.  Attempts to repair the left ventricle were unsuccessful and the patient passed on the operating room table at 1425.  Full details of the procedure are documented separately in a formal operative note.      _____________  Discharge Vitals Blood pressure 106/63, pulse 80, temperature (!) 97.5 F (36.4 C), temperature source Oral, resp. rate 19, height 5\' 8"  (1.727 m), weight 61.2 kg, SpO2 100 %.  Filed Weights   04-28-20 0904 04/28/20 1400  Weight: 61.2 kg 61.2 kg    Labs & Radiologic Studies    CBC No results for input(s): WBC, NEUTROABS, HGB, HCT, MCV, PLT in the last 72 hours. Basic Metabolic Panel No results for input(s): NA, K, CL, CO2, GLUCOSE, BUN, CREATININE, CALCIUM, MG, PHOS in the last 72 hours. Liver Function Tests No results for input(s): AST, ALT, ALKPHOS, BILITOT, PROT, ALBUMIN in the last 72 hours. No results for input(s): LIPASE, AMYLASE in the last 72 hours. Cardiac Enzymes No results for input(s): CKTOTAL, CKMB, CKMBINDEX, TROPONINI in the last 72 hours. BNP Invalid input(s): POCBNP D-Dimer No results for input(s): DDIMER in the last 72 hours. Hemoglobin A1C No results for input(s):  HGBA1C in the last 72 hours. Fasting Lipid Panel No results for input(s): CHOL, HDL, LDLCALC, TRIG, CHOLHDL, LDLDIRECT in the last 72 hours. Thyroid Function Tests No results for input(s): TSH, T4TOTAL, T3FREE, THYROIDAB in the last 72 hours.  Invalid input(s): FREET3 _____________  DG Chest 2 View  Result Date: 03/27/2020 CLINICAL DATA:  Preop TAVR 04/22/20. EXAM: CHEST - 2 VIEW COMPARISON:  01/01/2020 FINDINGS: Lungs are adequately inflated demonstrate a small amount of bilateral pleural fluid with flattening of the hemidiaphragms. No focal airspace process. Cardiomediastinal silhouette and remainder the exam is unchanged.  IMPRESSION: No evidence of a small amount of bilateral pleural fluid. Electronically Signed   By: Marin Olp M.D.   On: 03/27/2020 19:25   CT CORONARY MORPH W/CTA COR W/SCORE W/CA W/CM &/OR WO/CM  Addendum Date: 03/11/2020   ADDENDUM REPORT: 03/11/2020 17:47 CLINICAL DATA:  Severe Aortic Stenosis. EXAM: Cardiac TAVR CT TECHNIQUE: The patient was scanned on a [employer]. A 120 kV retrospective scan was triggered in the descending thoracic aorta at 111 HU's. Gantry rotation speed was 250 msecs and collimation was .6 mm. No beta blockade or nitro were given. The 3D data set was reconstructed in 5% intervals of the R-R cycle. Systolic and diastolic phases were analyzed on a dedicated work station using MPR, MIP and VRT modes. The patient received 80 cc of contrast. FINDINGS: Image quality: Excellent. Noise artifact is: Limited. Valve Morphology: The aortic valve is tricuspid. The tricuspid valve is severely calcified with bulky calcification of the RCC and NCC extending to the base of the leaflets. Aortic Valve Calcium score: 4715 Aortic annular dimension: Phase assessed: 20% Annular area: 495 mm2 Annular perimeter: 80.7 mm Max diameter: 28.3 mm Min diameter: 22.6 mm Annular and subannular calcification: Minimal, spotty annular calcification noted. Minimal LVOT calcification extending from the [place]. Optimal coplanar projection: RAO 23 CAU 59. There is extreme rotation of the cardiac structures due to the anterior course of the aortic arch. This has resulted in extreme counter-clockwise rotation of the aortic root. There is no apparent congenital anomaly. Coronary Artery Height above Annulus: Left Main: 19 mm Right Coronary: 19.3 mm Sinus of Valsalva Measurements: Non-coronary: 32 mm Right-coronary: 32 mm Left-coronary: 31 mm Sinus of Valsalva Height: Non-coronary: 21.3 mm Right-coronary: 23.1 mm Left-coronary: 26.9 mm Sinotubular Junction: 29 mm Ascending Thoracic Aorta: 34 mm Coronary Arteries: Normal  coronary origin. Right dominance. The study was performed without use of NTG and is insufficient for plaque evaluation. However, there appears to be minimal calcified plaque (<25%) in the LAD and RCA. The LCX is patent. Cardiac Morphology: Right Atrium: Right atrial size is within normal limits. Right Ventricle: The right ventricular cavity is within normal limits. Left Atrium: Left atrial size is normal in size with no left atrial appendage filling defect. Left Ventricle: The ventricular cavity size is within normal limits. There are no stigmata of prior infarction. There is no abnormal filling defect. Normal left ventricular function (LVEF=68%). No regional wall motion abnormalities. Pulmonary arteries: Normal in size without proximal filling defect. Pulmonary veins: Normal pulmonary venous drainage. Pericardium: Normal thickness with no significant effusion or calcium present. Mitral Valve: The mitral valve is normal structure without significant calcification. Extra-cardiac findings: See attached radiology report for non-cardiac structures. IMPRESSION: 1. Annular measurements appropriate for 26 mm Edwards Sapien 3 TAVR. 2. Severe bulky calcifications noted on the RCC/NCC. 3. Minimal annular and subannular calcifications. 4. Sufficient coronary to annulus distance. 5. Optimal Fluoroscopic Angle for Delivery: RAO 23 CAU  34. 6. There is extreme rotation of the cardiac structures due to the anterior course of the aortic arch with very tortuous course. Resultant extreme counter-clockwise rotation of the aortic root noted. There is no apparent congenital anomaly. [place] B[place] T. Audie Box, MD Electronically Signed   By: Eleonore Chiquito   On: 03/11/2020 17:47   Result Date: 03/11/2020 EXAM: OVER-READ INTERPRETATION  CT CHEST The following report is an over-read performed by radiologist Dr. Vinnie Langton of [facility], [place] on 03/11/2020. This over-read does not include interpretation of cardiac or coronary anatomy or  pathology. The coronary calcium score/coronary CTA interpretation by the cardiologist is attached. COMPARISON:  Chest CTA 12/31/2019. FINDINGS: Extracardiac findings will be described separately under dictation for contemporaneously obtained CTA chest, abdomen and pelvis. IMPRESSION: Please see separate dictation for contemporaneously obtained CTA chest, abdomen and pelvis dated 03/11/2020 for full description of relevant extracardiac findings. Electronically Signed: By: Vinnie Langton M.D. On: 03/11/2020 13:08   CT ANGIO CHEST AORTA W/CM & OR WO/CM  Result Date: 03/11/2020 CLINICAL DATA:  86 year old male with history of severe aortic stenosis. Preprocedural study prior to potential transcatheter aortic valve replacement (TAVR) procedure. EXAM: CT CHEST, ABDOMEN AND PELVIS WITHOUT CONTRAST TECHNIQUE: Multidetector CT imaging of the chest, abdomen and pelvis was performed following the standard protocol without IV contrast. COMPARISON:  CT of the abdomen and pelvis 02/10/2020. FINDINGS: CT CHEST FINDINGS Cardiovascular: Heart size is normal. There is no significant pericardial fluid, thickening or pericardial calcification. There is aortic atherosclerosis, as well as atherosclerosis of the great vessels of the mediastinum and the coronary arteries, including calcified atherosclerotic plaque in the left anterior descending coronary artery. Severe thickening and calcification of the aortic valve. Mediastinum/Nodes: No pathologically enlarged mediastinal or hilar lymph nodes. Large hiatal hernia. No axillary lymphadenopathy. Lungs/Pleura: Moderate bilateral pleural effusions with some passive subsegmental atelectasis in the lungs bilaterally. Atelectasis and/or post infectious or inflammatory scarring in the medial aspect of the left upper lobe. No definite suspicious appearing pulmonary nodules or masses are noted. No acute consolidative airspace disease. Musculoskeletal: There are no aggressive appearing lytic  or blastic lesions noted in the visualized portions of the skeleton. CT ABDOMEN PELVIS FINDINGS Hepatobiliary: No suspicious cystic or solid hepatic lesions. No intra or extrahepatic biliary ductal dilatation. Gallbladder is normal in appearance. Pancreas: Multiple small low-attenuation lesions scattered throughout the body and tail of the pancreas, largest of which is in the distal body measuring 1.3 cm in diameter (axial image 109 of series 15). No peripancreatic fluid collections or inflammatory changes. Spleen: Unremarkable. Adrenals/Urinary Tract: 11 mm low-attenuation lesion in the lower pole of the left kidney, compatible with a simple cyst. Right kidney and bilateral adrenal glands are normal in appearance. No hydroureteronephrosis in the abdomen or pelvis. Foley balloon catheter in place within the lumen of the urinary bladder which is nearly decompressed. Mild subjective mural thickening in the urinary bladder which could be accentuated by under distension. Tiny amount of gas non dependently in the lumen of the urinary bladder, presumably iatrogenic. Stomach/Bowel: Intra-abdominal portion of the stomach is unremarkable. No pathologic dilatation of small bowel or colon. Numerous colonic diverticulae are noted, without surrounding inflammatory changes to suggest an acute diverticulitis at this time. Normal appendix. Vascular/Lymphatic: Aortic atherosclerosis, with vascular findings and measurements pertinent to potential TAVR procedure, as detailed below. No aneurysm or dissection noted in the abdominal or pelvic vasculature. No lymphadenopathy noted in the abdomen or pelvis. Reproductive: Brachytherapy implants throughout the prostate gland. Prostate gland and seminal  vesicles are otherwise unremarkable in appearance. Other: No significant volume of ascites.  No pneumoperitoneum. Musculoskeletal: Chronic appearing compression fracture of L3 with 70% loss of central vertebral body height. Status post PLIF at  L4-L5. There are no aggressive appearing lytic or blastic lesions noted in the visualized portions of the skeleton. IMPRESSION: 1. Vascular findings and measurements pertinent to potential TAVR procedure, as detailed above. 2. Severe thickening calcification of the aortic valve, compatible with reported clinical history of severe aortic stenosis. 3. Large hiatal hernia which may have implications for intraprocedural TEE monitoring. 4. Moderate bilateral pleural effusions. 5. Multiple small low-attenuation lesions scattered throughout the body and tail of the pancreas measuring up to 1.3 cm in diameter in the distal body. These are nonspecific, but the possibility of multifocal side branch IPMN (intraductal papillary mucinous neoplasm) should be considered. Given the confluency of these small lesions in the tail of the pancreas, further evaluation with nonemergent abdominal MRI with and without IV gadolinium with MRCP is recommended at this time to better evaluate these findings and establish a baseline for future follow-up examinations. 6. Severe colonic diverticulosis without evidence of acute diverticulitis at this time. 7. Additional incidental findings, as above. Electronically Signed   By: Vinnie Langton M.D.   On: 03/11/2020 13:45   VAS US CAROTID  Result Date: 03/27/2020 Carotid Arterial Duplex Study Indications:       Pre TAVR, severe aortic stenosis. Risk Factors:      Hyperlipidemia. Comparison Study:  No prior studies. Performing Technologist: Darlin Coco  Examination Guidelines: A complete evaluation includes B-mode imaging, spectral Doppler, color Doppler, and power Doppler as needed of all accessible portions of each vessel. Bilateral testing is considered an integral part of a complete examination. Limited examinations for reoccurring indications may be performed as noted.  Right Carotid Findings: +----------+--------+--------+--------+---------------------+------------------+           PSV  cm/sEDV cm/sStenosisPlaque Description   Comments           +----------+--------+--------+--------+---------------------+------------------+ CCA Prox  61      19                                   intimal thickening +----------+--------+--------+--------+---------------------+------------------+ CCA Distal69      23                                   intimal thickening +----------+--------+--------+--------+---------------------+------------------+ ICA Prox  63      23      1-39%   irregular and                                                             calcific                                +----------+--------+--------+--------+---------------------+------------------+ ICA Distal70      30                                                      +----------+--------+--------+--------+---------------------+------------------+  ECA       48      8                                                       +----------+--------+--------+--------+---------------------+------------------+ +----------+--------+-------+----------------+-------------------+           PSV cm/sEDV cmsDescribe        Arm Pressure (mmHG) +----------+--------+-------+----------------+-------------------+ [id number]             Multiphasic, WNL                    +----------+--------+-------+----------------+-------------------+ +---------+--------+--+--------+-+---------+ VertebralPSV cm/s26EDV cm/s7Antegrade +---------+--------+--+--------+-+---------+  Left Carotid Findings: +----------+--------+--------+--------+------------------+------------------+           PSV cm/sEDV cm/sStenosisPlaque DescriptionComments           +----------+--------+--------+--------+------------------+------------------+ CCA Prox  82      26                                intimal thickening +----------+--------+--------+--------+------------------+------------------+ CCA Distal75      20                                                    +----------+--------+--------+--------+------------------+------------------+ ICA Prox  59      17      1-39%   hypoechoic                           +----------+--------+--------+--------+------------------+------------------+ ICA Distal55      20                                                   +----------+--------+--------+--------+------------------+------------------+ ECA       86      10                                                   +----------+--------+--------+--------+------------------+------------------+ +----------+--------+--------+----------------+-------------------+           PSV cm/sEDV cm/sDescribe        Arm Pressure (mmHG) +----------+--------+--------+----------------+-------------------+ [id number]              Multiphasic, WNL                    +----------+--------+--------+----------------+-------------------+ +---------+--------+--+--------+--+---------+ VertebralPSV cm/s43EDV cm/s13Antegrade +---------+--------+--+--------+--+---------+   Summary: Right Carotid: Velocities in the right ICA are consistent with a 1-39% stenosis.                The extracranial vessels were near-normal with only minimal wall                thickening or plaque. Left Carotid: Velocities in the left ICA are consistent with a 1-39% stenosis.               The extracranial vessels were near-normal with only minimal wall  thickening or plaque. Vertebrals:  Bilateral vertebral arteries demonstrate antegrade flow. Subclavians: Normal flow hemodynamics were seen in bilateral subclavian              arteries. *See table(s) above for measurements and observations.  Electronically signed by Servando Snare MD on 03/27/2020 at 4:23:46 PM.    Final    Structural Heart Procedure  Result Date: 04-04-2020 See surgical note for result.  CT Angio Abd/Pel w/ and/or w/o  Result Date: 03/11/2020 CLINICAL DATA:   86 year old male with history of severe aortic stenosis. Preprocedural study prior to potential transcatheter aortic valve replacement (TAVR) procedure. EXAM: CT CHEST, ABDOMEN AND PELVIS WITHOUT CONTRAST TECHNIQUE: Multidetector CT imaging of the chest, abdomen and pelvis was performed following the standard protocol without IV contrast. COMPARISON:  CT of the abdomen and pelvis 02/10/2020. FINDINGS: CT CHEST FINDINGS Cardiovascular: Heart size is normal. There is no significant pericardial fluid, thickening or pericardial calcification. There is aortic atherosclerosis, as well as atherosclerosis of the great vessels of the mediastinum and the coronary arteries, including calcified atherosclerotic plaque in the left anterior descending coronary artery. Severe thickening and calcification of the aortic valve. Mediastinum/Nodes: No pathologically enlarged mediastinal or hilar lymph nodes. Large hiatal hernia. No axillary lymphadenopathy. Lungs/Pleura: Moderate bilateral pleural effusions with some passive subsegmental atelectasis in the lungs bilaterally. Atelectasis and/or post infectious or inflammatory scarring in the medial aspect of the left upper lobe. No definite suspicious appearing pulmonary nodules or masses are noted. No acute consolidative airspace disease. Musculoskeletal: There are no aggressive appearing lytic or blastic lesions noted in the visualized portions of the skeleton. CT ABDOMEN PELVIS FINDINGS Hepatobiliary: No suspicious cystic or solid hepatic lesions. No intra or extrahepatic biliary ductal dilatation. Gallbladder is normal in appearance. Pancreas: Multiple small low-attenuation lesions scattered throughout the body and tail of the pancreas, largest of which is in the distal body measuring 1.3 cm in diameter (axial image 109 of series 15). No peripancreatic fluid collections or inflammatory changes. Spleen: Unremarkable. Adrenals/Urinary Tract: 11 mm low-attenuation lesion in the lower  pole of the left kidney, compatible with a simple cyst. Right kidney and bilateral adrenal glands are normal in appearance. No hydroureteronephrosis in the abdomen or pelvis. Foley balloon catheter in place within the lumen of the urinary bladder which is nearly decompressed. Mild subjective mural thickening in the urinary bladder which could be accentuated by under distension. Tiny amount of gas non dependently in the lumen of the urinary bladder, presumably iatrogenic. Stomach/Bowel: Intra-abdominal portion of the stomach is unremarkable. No pathologic dilatation of small bowel or colon. Numerous colonic diverticulae are noted, without surrounding inflammatory changes to suggest an acute diverticulitis at this time. Normal appendix. Vascular/Lymphatic: Aortic atherosclerosis, with vascular findings and measurements pertinent to potential TAVR procedure, as detailed below. No aneurysm or dissection noted in the abdominal or pelvic vasculature. No lymphadenopathy noted in the abdomen or pelvis. Reproductive: Brachytherapy implants throughout the prostate gland. Prostate gland and seminal vesicles are otherwise unremarkable in appearance. Other: No significant volume of ascites.  No pneumoperitoneum. Musculoskeletal: Chronic appearing compression fracture of L3 with 70% loss of central vertebral body height. Status post PLIF at L4-L5. There are no aggressive appearing lytic or blastic lesions noted in the visualized portions of the skeleton. IMPRESSION: 1. Vascular findings and measurements pertinent to potential TAVR procedure, as detailed above. 2. Severe thickening calcification of the aortic valve, compatible with reported clinical history of severe aortic stenosis. 3. Large hiatal hernia which may have implications for intraprocedural  TEE monitoring. 4. Moderate bilateral pleural effusions. 5. Multiple small low-attenuation lesions scattered throughout the body and tail of the pancreas measuring up to 1.3 cm in  diameter in the distal body. These are nonspecific, but the possibility of multifocal side branch IPMN (intraductal papillary mucinous neoplasm) should be considered. Given the confluency of these small lesions in the tail of the pancreas, further evaluation with nonemergent abdominal MRI with and without IV gadolinium with MRCP is recommended at this time to better evaluate these findings and establish a baseline for future follow-up examinations. 6. Severe colonic diverticulosis without evidence of acute diverticulitis at this time. 7. Additional incidental findings, as above. Electronically Signed   By: Vinnie Langton M.D.   On: 03/11/2020 13:45     Signed, Angelena Form, PA-C 2020/04/12, 3:52 PM [phone]   Rexene Alberts, MD 04/12/20 6:07 PM

## 2020-04-09 NOTE — Interval H&P Note (Signed)
History and Physical Interval Note:  Apr 26, 2020 11:32 AM  Carlos Manning  has presented today for surgery, with the diagnosis of Severe Aortic Stenosis.  The various methods of treatment have been discussed with the patient and family. After consideration of risks, benefits and other options for treatment, the patient has consented to  Procedure(s): TRANSCATHETER AORTIC VALVE REPLACEMENT, SUBCLAVIAN (N/A) TRANSESOPHAGEAL ECHOCARDIOGRAM (TEE) (N/A) as a surgical intervention.  The patient's history has been reviewed, patient examined, no change in status, stable for surgery.  I have reviewed the patient's chart and labs.  Questions were answered to the patient's satisfaction.     [name]

## 2020-04-09 NOTE — Final Progress Note (Signed)
Anesthesiology Note:  As noted, Saksham Akkerman is an 86 year old male with severe aortic stenosis who underwent transcatheter aortic valve replacement via the left subclavian approach under general endotracheal anesthesia.  The procedure was technically difficult due to the tortuous course of the ascending aorta and the lateral orientation of the heart in the chest due to a large hiatal hernia present. The anatomy was unsuitable for the trans-femoral approach and it was elected to do a left trans-subclavian artery access.    Following balloon valvuloplasty of the aortic valve, the patient developed severe hypotension. A large pericardial effusion with tamponade was noted on TEE.  The patient was aggressively treated with IV volume resuscitation including packed red blood cells, albumin 5%, and IV crystalloids, as well as vasopressors including Levophed and epinephrine.   Following removal of the pericardial blood the patient's hemodynamics temporarily stabilized. The TAVR valve was then successfully deployed. A median sternotomy was then performed and it was discovered that a laceration was present on the posterior aspect of the left ventricle which was deemed unrepairable.  It was then elected to cease further resuscitative efforts and the patient expired at 1425.  The family was notified by Drs. McAlhany and [employer].  Roberts Gaudy, MD

## 2020-04-09 NOTE — Anesthesia Procedure Notes (Signed)
Procedure Name: Intubation Date/Time: 04-21-20 11:14 AM Performed by: Inda Coke, CRNA Pre-anesthesia Checklist: Patient identified, Emergency Drugs available, Suction available and Patient being monitored Patient Re-evaluated:Patient Re-evaluated prior to induction Oxygen Delivery Method: Circle System Utilized Preoxygenation: Pre-oxygenation with 100% oxygen Induction Type: IV induction Ventilation: Mask ventilation without difficulty and Oral airway inserted - appropriate to patient size Laryngoscope Size: Sabra Heck and 3 Grade View: Grade I Tube type: Oral Tube size: 7.5 mm Number of attempts: 1 Airway Equipment and Method: Stylet and Oral airway Placement Confirmation: ETT inserted through vocal cords under direct vision,  positive ETCO2 and breath sounds checked- equal and bilateral Secured at: 23 cm Tube secured with: Tape Dental Injury: Teeth and Oropharynx as per pre-operative assessment

## 2020-04-09 NOTE — Op Note (Signed)
[facility]   MULTIDISCIPLINARY HEART VALVE TEAM   TAVR OPERATIVE NOTE   Date of Procedure:  04/19/20  Preoperative Diagnosis: Severe Aortic Stenosis   Postoperative Diagnosis: Same   Procedure:    Transcatheter Aortic Valve Replacement - Left Trans-Subclavian Approach  Edwards Sapien 3 Ultra THV (size 26 mm, model # 9750TFX, serial # [id number])  . Subxyphoid Pericardial Window for evacuation of pericardial tamponade  . Median Sternotomy for attempted repair of left ventricular rupture   Co-Surgeons:  Valentina Gu. Roxy Manns, MD and [name], MD  Anesthesiologist:  Roberts Gaudy, MD  Echocardiographer:  Ena Dawley, MD  Pre-operative Echo Findings:  Critical aortic stenosis  Normal left ventricular systolic function  Post-operative Findings:  Trivial paravalvular leak  Hemopericardium with acute pericardial tamponade  Catastrophic left ventricular rupture    BRIEF CLINICAL NOTE AND INDICATIONS FOR SURGERY  Patient is an 86 year old widower with aortic stenosis, nonischemic cardiomyopathy, chronic systolic congestive heart failure, persistent atrial fibrillation, stage II chronic kidney disease with recent acute exacerbation due to acute kidney injury caused by bladder outlet obstruction, GE reflux disease, hyperlipidemia, prostate cancer, iron deficient anemia, and short-term memory loss who has been referred for surgical consultation to discuss treatment options for management of severe symptomatic aortic stenosis.   Patient does not recall how long he has known of the presence of a heart murmur but he has been followed for several years by Roque Cash at [facility] with known history of aortic stenosis.  Echocardiogram performed December 10, 2019 at [facility] revealed what was reported to be normal left ventricular systolic function with very severe aortic stenosis with peak velocity across aortic valve measured 5.2 m/s  corresponding to peak and mean transvalvular gradients estimated 110 and 72 mmHg respectively and aortic valve area calculated 0.3 cm.  Shortly after that the patient was admitted to [facility] with severe generalized weakness and resting shortness of breath with new onset rapid atrial fibrillation.  Atrial fibrillation was treated with rate control and he was started on Eliquis for long-term anticoagulation.  He responded to intravenous diuretic therapy and antibiotics.  Echocardiogram performed December 31, 2019 was reported to demonstrate severe left ventricular systolic dysfunction with ejection fraction estimated only 20 to 25%.  There were findings consistent with severe low-flow low gradient aortic stenosis.  Diagnostic cardiac catheterization was performed January 06, 2020 and revealed left ventricular systolic dysfunction with ejection fraction estimated 40 to 45%.  The patient had normal coronary artery anatomy with no significant coronary artery disease.  There was mild to moderate pulmonary hypertension.  Mean transvalvular gradient was 36 mmHg corresponding to aortic valve area calculated 0.88 cm.  Patient was referred to the multidisciplinary heart valve clinic and has been evaluated previously by Dr. Angelena Form.  CT angiography was performed and the patient was referred for surgical consultation.  During the interim period of time the patient has developed hematuria and bladder outlet obstruction with intermittent acute kidney injury.  He has been cared for by a urologist in [facility].  He has failed several voiding trials and now has an indwelling Foley catheter in place.  Long-term anticoagulation using Eliquis has been held due to gross hematuria.  During the course of the patient's preoperative work up they have been evaluated comprehensively by a multidisciplinary team of specialists coordinated through the [facility].  They have been demonstrated to suffer from symptomatic  severe aortic stenosis as noted above. The patient has been counseled extensively as to the relative risks and benefits of all options for the treatment of severe aortic stenosis including long term medical therapy, conventional surgery for aortic valve replacement, and transcatheter aortic valve replacement.  All questions have been answered, and the patient provides full informed consent for the operation as described.   DETAILS OF THE OPERATIVE PROCEDURE  PREPARATION:    The patient is brought to the operating room on the above mentioned date and appropriate monitoring was established by the anesthesia team. The patient is placed in the supine position on the operating table.  Intravenous antibiotics are administered.  General endotracheal anesthesia is induced uneventfully.    Baseline transesophageal echocardiogram was performed. The patient's chest, abdomen, both groins, and both lower extremities are prepared and draped in a sterile manner. A time out procedure is performed.   PERIPHERAL ACCESS:    Using the modified Seldinger technique, femoral arterial and venous access was obtained with placement of 6 Fr sheaths on the right side.  A pigtail diagnostic catheter was passed through the right femoral arterial sheath under fluoroscopic guidance into the aortic root.  A temporary transvenous pacemaker catheter was passed through the right femoral venous sheath under fluoroscopic guidance into the right ventricle.  The pacemaker was tested to ensure stable lead placement and pacemaker capture. Aortic root angiography was performed in order to determine the optimal angiographic angle for valve deployment.  This was unusually complicated for the step because of the patient's unusual anatomy with a very large hiatal hernia and unusual transverse and somewhat twisted orientation of the aortic root.  Extremely steep angulation was  required in multiple injections performed using angiography.  Ultimately the patient's body position was also manipulated to improve angulation and a satisfactory deployment angle was identified.   TRANS-SUBCLAVIAN ACCESS:   A small oblique incision is made in the left deltopectoral groove.  The incision is completed through the subcutaneous tissues with electrocautery.  The pectoralis muscle fibers are split longitudinally and the deep pectoralis fascia incised.  Sharp dissection is utilized to dissect beneath the pectoralis fascia.  The insertion of the pectoralis minor muscle was retracted laterally.  The left axillary artery is identified and encircled with an elastic tape.  A pair of Abbott Perclose percutaneous closure devices were placed and a 6 French sheath replaced into the axillary artery.  The patient was heparinized systemically and ACT verified > 250 seconds.  The left axillary artery is cannulated using a Seldinger technique and a guidewire advanced into the aortic root using fluoroscopy.  An 8 French sheath is passed over the guidewire into the axillary artery.  An AL-2 catheter was used to direct a straight-tip exchange length wire across the native aortic valve into the left ventricle. This was technically challenging due to the extremely severe aortic stenosis.  The straight wire was exchanged out for a pigtail catheter and position was confirmed in the LV apex.  The pigtail catheter was exchanged for an Amplatz Extra-stiff wire in the LV apex.  A 14 Fr transfemoral E-sheath was introduced into the left axillary artery after progressively dilating over the Amplatz Extra-stiff wire. Echocardiography was utilized to confirm appropriate wire position and no sign of entanglement in the mitral subvalvular apparatus.   BALLOON AORTIC VALVULOPLASTY:   Balloon aortic valvuloplasty was performed using a 23 mm valvuloplasty balloon.  Once optimal position was achieved, BAV was done under rapid  ventricular pacing.  Upon  initial attempt at BAV pacing capture was lost and balloon valvuloplasty aborted.  The temporary pacing wire was manipulated to readjust his position and retested for adequate capture.  BAV was subsequently done under rapid ventricular pacing without difficulty.  The patient initially recovered well hemodynamically.    SUBXYPHOID PERICARDIAL WINDOW:   Shortly after completion of balloon valvuloplasty the patient suddenly developed profound hypotension without a change in heart rhythm.  Transesophageal echocardiogram was reexamined and a new large pericardial effusion was identified, consistent with acute pericardial tamponade.  The possibility of right ventricular perforation from the temporary transvenous pacing wire was entertained and decision was made to proceed directly to emergency subxiphoid pericardial window.  A small vertical incision is made in the upper abdomen immediately below the xiphoid process.  The incision is completed through the subcutaneous tissues and the linea alba was divided in the midline.  The rectus abdominis muscle was retracted in the preop peritoneal space developed.  The anterior surface of the pericardial sac was identified and incised.  There was immediate return of large amount of bright red blood.  Once the hemopericardium was evacuated the patient's hemodynamics recovered.  At this juncture there seems to be no sign of catastrophic aortic bleeding and the patient's hemodynamic condition continues to improve with volume resuscitation.  Under the presumption of likely small injury to the right ventricle from transvenous pacing wire, a decision is made to proceed with valve deployment.  A 32 French Bard drain was placed into the pericardial sac through a separate stab incision.   TRANSCATHETER HEART VALVE DEPLOYMENT:   An Edwards Sapien 3 Ultra transcatheter heart valve (size 26 mm, model #9750TFX, serial #[id number]) was prepared and crimped per  manufacturer's guidelines, and the proper orientation of the valve is confirmed on the [employer] delivery system. The valve was advanced through the introducer sheath using normal technique until in an appropriate position in the aorta beyond the sheath tip. The balloon was then retracted and using the fine-tuning wheel was centered on the valve. The valve was then advanced across the aortic valve annulus. The Commander catheter was retracted using normal technique. Once final position of the valve has been confirmed by angiographic assessment, the valve is deployed while temporarily holding ventilation and during rapid ventricular pacing to maintain systolic blood pressure < 50 mmHg and pulse pressure < 10 mmHg. The balloon inflation is held for >3 seconds after reaching full deployment volume. Once the balloon has fully deflated the balloon is retracted into the ascending aorta and valve function is assessed using echocardiography. There is felt to be trivial paravalvular leak and no central aortic insufficiency.  The patient's hemodynamic recovery following valve deployment is good.  The deployment balloon and guidewire are both removed.   The sheath was removed and axillary artery closure performed.  Protamine was administered once arterial repair was complete.  The small incision in the left deltopectoral groove was closed in multiple layers and the skin closed with subcuticular skin closure.     MEDIAN STERNOTOMY:   At this juncture the subxiphoid pericardial incision was reexplored.  There was significant continuous bleeding despite reversal of heparin using protamine.  The bleeding was observed for a period of time, during which time the patient developed severe hypotension despite ongoing volume resuscitation.  A decision is made to proceed with exploratory median sternotomy because of continued ongoing bleeding.  Median sternotomy incision is performed.  The sternum was divided with a  sagittal saw.  The pericardium  was opened and a retractor placed.  After evacuation of all of the blood in the pericardial space the heart is gently elevated to expose the entire surface of the right ventricle.  No sign of injury is identified.  Further exploration identifies bright red bleeding high along the anterior wall of the left ventricle, well away from the aortic root and lateral to the right ventricular outflow tract.  Initially this bleeding is controlled with manual palpation and subsequently several pledgeted sutures are placed.  With initial apparent control, bleeding extends outside of the repair consistent with transmural injury to the left ventricle related to initial endocardial injury from either the Amplatz extra-stiff wire utilized for balloon valvuloplasty and valve deployment or straight wire utilized initially to cross the aortic valve.  Despite continued attempts at direct suture repair, exsanguinating hemorrhage developed and the patient succumbed.  The patient has not felt to be candidate for use of cardiopulmonary bypass or other heroic efforts to repair the left ventricular injury because of advanced age and comorbid medical conditions.  Patient expired in the operating room at 14:25 on 04-29-2020.   The patient's son was notified in person.  The medical examiner was notified.     Rexene Alberts, MD April 29, 2020 2:43 PM

## 2020-04-09 NOTE — Progress Notes (Signed)
  Echocardiogram Echocardiogram Transesophageal has been performed.  Carlos Manning Apr 04, 2020, 2:46 PM

## 2020-04-09 NOTE — CV Procedure (Signed)
[facility]  TAVR OPERATIVE NOTE   Date of Procedure:  04-14-20  Preoperative Diagnosis: Severe Aortic Stenosis   Postoperative Diagnosis: Same   Procedure:    Transcatheter Aortic Valve Replacement - TransSubclavian Approach  Edwards Sapien 3 THV (size 6mm, model # [id number], serial # [id number])   Co-Surgeons:  [name], MD and Valentina Gu. Roxy Manns, MD  Anesthesiologist:  Linna Caprice  Echocardiographer:  Meda Coffee  Pre-operative Echo Findings:  Critical aortic stenosis  Normal left ventricular systolic function  Post-operative Echo Findings:  Mild paravalvular leak   BRIEF CLINICAL NOTE AND INDICATIONS FOR SURGERY  86 yo male with history of persistent atrial fibrillation, GERD, HLD, CKD stage 2, chronic systolic CHF, non-ischemic cardiomyopathy, prostate cancer, iron deficiency anemia, mild memory issues and severe aortic stenosis who is here today for TAVR. His echo from 12/10/19 with LVEF=60-65%. The mean gradient across the aortic valve was 72 mmHg and the peak gradient was 109 mmHg. AVA 0.3 cm2. He was admitted to [facility] March 2021 with weakness and shortness of breath and was found to have rapid atrial fibrillation, elevated troponin and acute volume overload. He was found to have large bilateral pleural effusion as well as pneumonia. He was treated with rate control, anticoagulated, diuresed and treated with antibiotics. Echo 12/31/19 with LVEF=20-25%. Mild to moderate mitral regurgitation. Severe aortic stenosis on echo felt to be low flow, low gradient AS. Cardiac cath 01/06/20 with reported normal coronary arteries. Pre TAVR workup with severe tortuosity of the thoracic aorta and rotated cardiac structures.   During the course of the patient's preoperative work up they have been evaluated comprehensively by a multidisciplinary team of specialists coordinated through the [facility] in the [facility].  They have been demonstrated to suffer from symptomatic severe aortic stenosis as noted above. The patient has been counseled extensively as to the relative risks and benefits of all options for the treatment of severe aortic stenosis including long term medical therapy, conventional surgery for aortic valve replacement, and transcatheter aortic valve replacement.  The patient has been independently evaluated by Dr. Roxy Manns with CT surgery and they are felt to be at high risk for conventional surgical aortic valve replacement. The surgeon indicated the patient would be a poor candidate for conventional surgery. Based upon review of all of the patient's preoperative diagnostic tests they are felt to be candidate for transcatheter aortic valve replacement using the transfemoral approach as an alternative to high risk conventional surgery.    Following the decision to proceed with transcatheter aortic valve replacement, a discussion has been held regarding what types of management strategies would be attempted intraoperatively in the event of life-threatening complications, including whether or not the patient would be considered a candidate for the use of cardiopulmonary bypass and/or conversion to open sternotomy for attempted surgical intervention.  The patient has been advised of a variety of complications that might develop peculiar to this approach including but not limited to risks of death, stroke, paravalvular leak, aortic dissection or other major vascular complications, aortic annulus rupture, device embolization, cardiac rupture or perforation, acute myocardial infarction, arrhythmia, heart block or bradycardia requiring permanent pacemaker placement, congestive heart failure, respiratory failure, renal failure, pneumonia, infection, other late complications related to structural valve deterioration or migration, or other complications that might ultimately cause a temporary or permanent  loss of functional independence or other long term morbidity.  The patient provides full informed consent for the procedure  as described and all questions were answered preoperatively.    DETAILS OF THE OPERATIVE PROCEDURE  PREPARATION:   The patient is brought to the operating room on the above mentioned date and central monitoring was established by the anesthesia team including placement of a radial arterial line. The patient is placed in the supine position on the operating table.  Intravenous antibiotics are administered. General anesthesia is used.   Baseline transesophageal echocardiogram was performed. The patient's chest, abdomen, both groins, and both lower extremities are prepared and draped in a sterile manner. A time out procedure is performed.   PERIPHERAL ACCESS:   Using the modified Seldinger technique, femoral arterial and venous access were obtained with placement of 6 Fr sheaths on the right side.  A pigtail diagnostic catheter was passed through the femoral arterial sheath under fluoroscopic guidance into the aortic root.  A temporary transvenous pacemaker catheter was passed through the femoral venous sheath under fluoroscopic guidance into the right ventricle.  The pacemaker was tested to ensure stable lead placement and pacemaker capture. Aortic root angiography was performed in order to determine the optimal angiographic angle for valve deployment.  TRANSSUBCLAVIAN ARTERY ACCESS: Please see Dr. Guy Sandifer full surgical note for details of the cut down to the left subclavian artery. The patient was heparinized systemically and ACT verified > 250 seconds.    The artery was cannulated with a needle and a J wire was then advanced into the aortic root. A 8 French sheath was placed into the subclavian artery. An AL-1 catheter was then used to direct a straight wire into the LV and the AL-1 catheter was advanced into the LV. The J wire was then used to trade out the AL-1 catheter for a  pigtail catheter. At this time, an Amplatz Extra stiff wire was advanced into the pigtail catheter and placed in the LV apex. The pigtail was removed.   A 14 Fr transfemoral E-sheath was introduced into the subclavian.   Given the critical aortic stenosis, we prepared for a balloon valvuloplasty. A 23 mm balloon was advanced over the wire and positioned across the aortic valve. Rapid pacing was used during balloon valvuloplasty. Follow valvuloplasty, the patient did not recover hemodynamically. TEE demonstrated a pericardial effusion and tamponade. Dr. Roxy Manns the performed a surgical pericardial window. Please see his note for details of this procedure. Excess blood was removed from the pericardial cavity and the patient's blood pressure stabilized.   At this time, we elected to deploy the 26 mm Edwards Sapien 3 THV.    TRANSCATHETER HEART VALVE DEPLOYMENT:  An Edwards Sapien 3 THV (size 26 mm) was prepared and crimped per manufacturer's guidelines, and the proper orientation of the valve is confirmed on the [employer] delivery system. The valve was advanced through the introducer sheath using normal technique until in an appropriate position in the ascending aorta beyond the sheath tip. The balloon was then retracted and using the fine-tuning wheel was centered on the valve. The valve was then advanced across the aortic valve annulus. The Commander catheter was retracted using normal technique. Once final position of the valve has been confirmed by angiographic assessment, the valve is deployed while temporarily holding ventilation and during rapid ventricular pacing to maintain systolic blood pressure < 50 mmHg and pulse pressure < 10 mmHg. The balloon inflation is held for >3 seconds after reaching full deployment volume. Once the balloon has fully deflated the balloon is retracted into the ascending aorta and valve function is assessed  using TTE. There is felt to be trivial to mild paravalvular  leak and no central aortic insufficiency.  The patient's hemodynamic recovery following valve deployment is good.  The deployment balloon and guidewire are both removed. Echo demostrated acceptable post-procedural gradients, stable mitral valve function, and trivial to mild AI.    The wound over the left subclavian artery as closed. At this time, the pericardial drain output increased. After careful consideration, Dr. Roxy Manns and I discussed open sternotomy to see if there were options to repair the source of bleeding.   Dr. Roxy Manns then performed a sternotomy. The anesthesia team managed pressors and blood transfusions. An LV pacing wire was placed. The bleeding was noted to be coming from a laceration of the LV wall, likely from a wire perforation. Attempt made at LV repair but this was not possible given the size of the defect. The catastrophic complication was not felt to be repairable. Pressors were stopped and the pacing lead was turned off. Family updated by myself, Dr. Roxy Manns and Nell Range, PA-C.   PROCEDURE COMPLETION:  Time of death: 2:25 pm.   [name] MD 04/25/2020 11:33 AM

## 2020-04-09 NOTE — Transfer of Care (Signed)
Immediate Anesthesia Transfer of Care Note  Patient: Carlos Manning  Procedure(s) Performed: TRANSCATHETER AORTIC VALVE REPLACEMENT, SUBCLAVIAN (N/A Chest) TRANSESOPHAGEAL ECHOCARDIOGRAM (TEE) (N/A )  Patient Location: Morgue  Anesthesia Type:General  Level of Consciousness: Expired  Airway & Oxygen Therapy: Expired  Post-op Assessment: Expired  Post vital signs: Expired  Last Vitals:  Vitals Value Taken Time  BP    Temp    Pulse    Resp    SpO2      Last Pain:  Vitals:   04-29-20 0913  TempSrc:   PainSc: 0-No pain         Complications: No complications documented.

## 2020-04-09 DEATH — deceased

## 2020-07-23 IMAGING — CT CT CTA ABD/PEL W/CM AND/OR W/O CM
2 of 16 series · 10 of 46 positions shown, 15 images · IV contrast (APPLIED)
Comparison: CT of the abdomen and pelvis 02/10/2020.

CLINICAL DATA: 88-year-old male with history of severe aortic
stenosis. Preprocedural study prior to potential transcatheter
aortic valve replacement (TAVR) procedure.

EXAM:
CT CHEST, ABDOMEN AND PELVIS WITHOUT CONTRAST
TECHNIQUE: Multidetector CT imaging of the chest, abdomen and pelvis was
performed following the standard protocol without IV contrast.

[Series 9: 5-95% · axial · 0.39mm/px · z∈[+1201,+1399]mm · 9 of 4040 slices shown, 13 images]
[im 368/4040  soft-tissue]
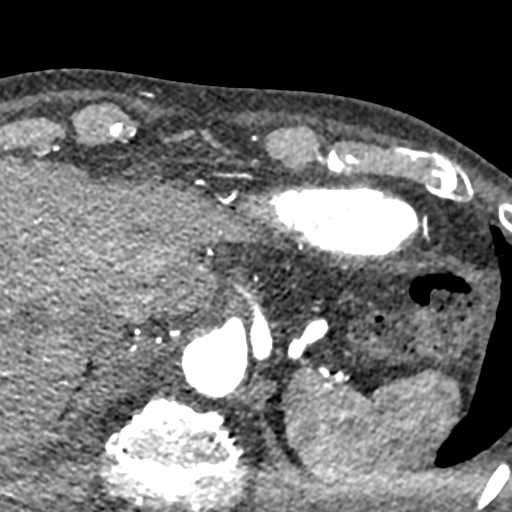
[im 368/4040  bone]
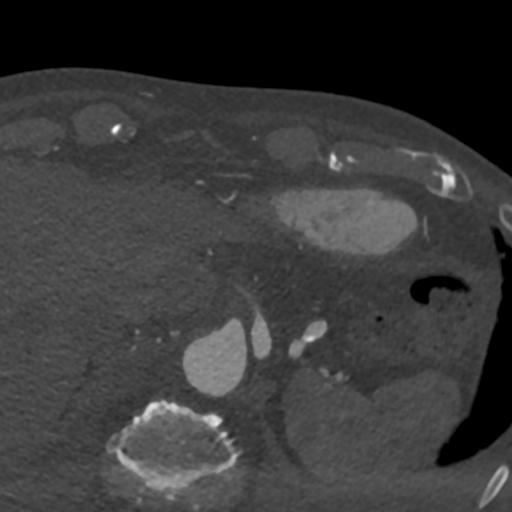
[im 735/4040  soft-tissue]
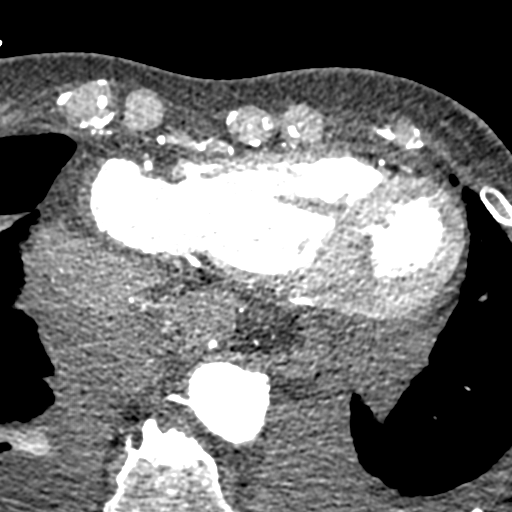
[im 1469/4040  soft-tissue]
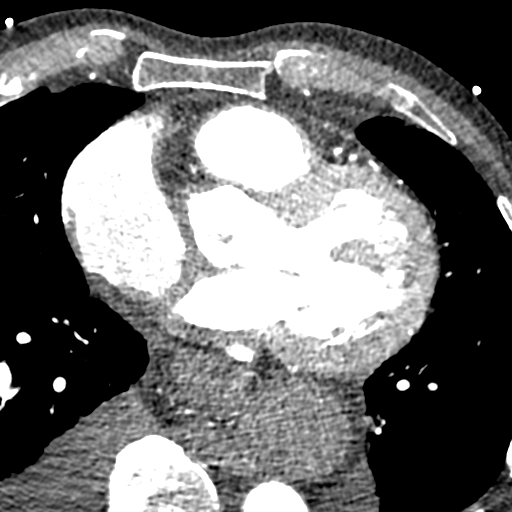
[im 1836/4040  soft-tissue]
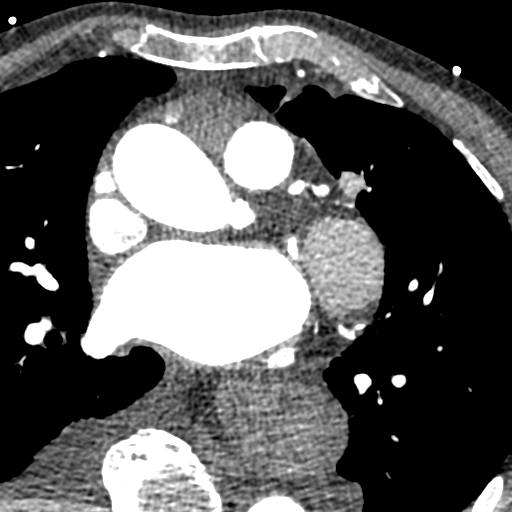
[im 2204/4040  soft-tissue]
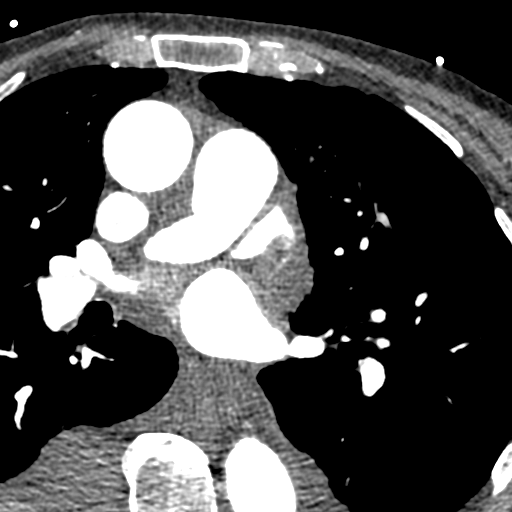
[im 2571/4040  soft-tissue]
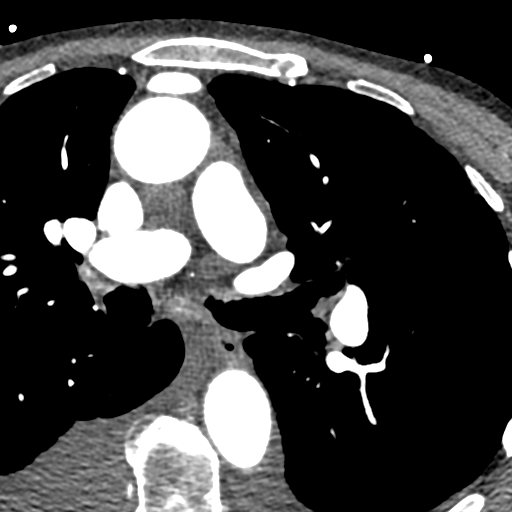
[im 2571/4040  lung]
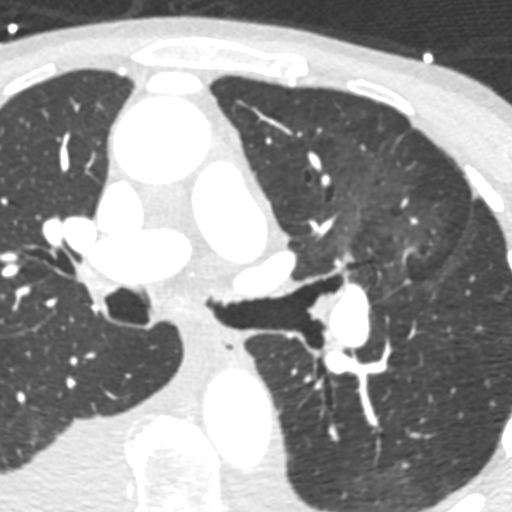
[im 2938/4040  lung]
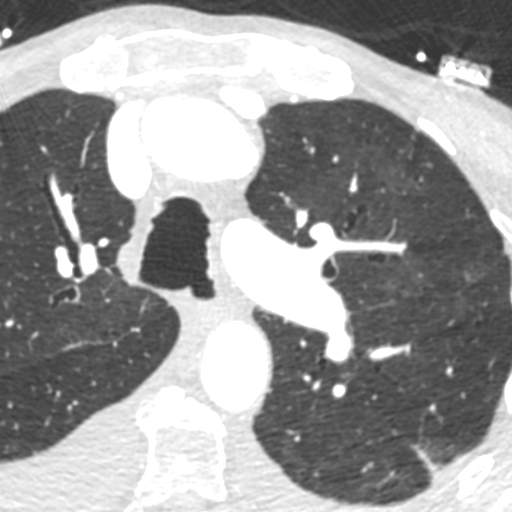
[im 3305/4040  soft-tissue]
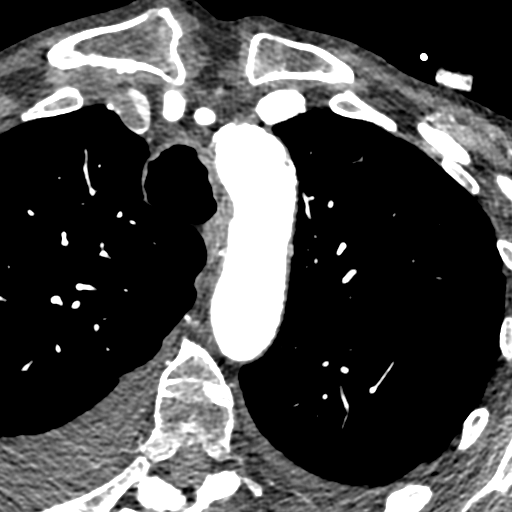
[im 3305/4040  lung]
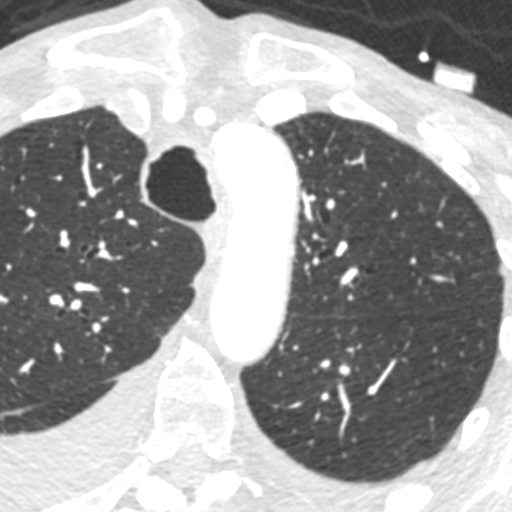
[im 3672/4040  soft-tissue]
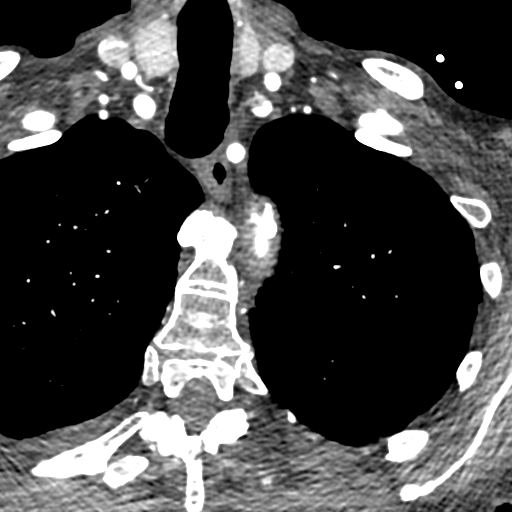
[im 3672/4040  lung]
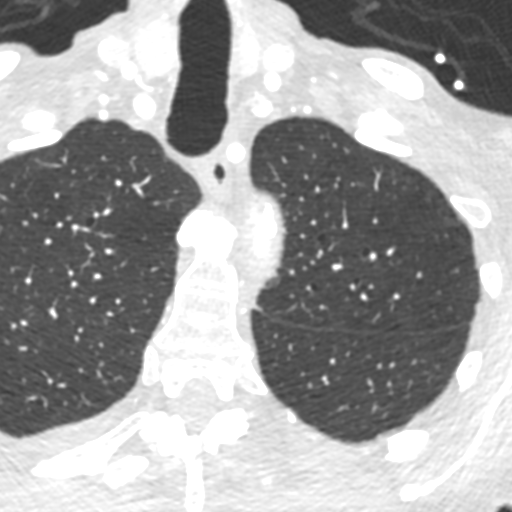

[Series 17: cor · coronal · 0.75mm/px · 1 of 131 slices shown, 2 images]
[im 66/131  soft-tissue]
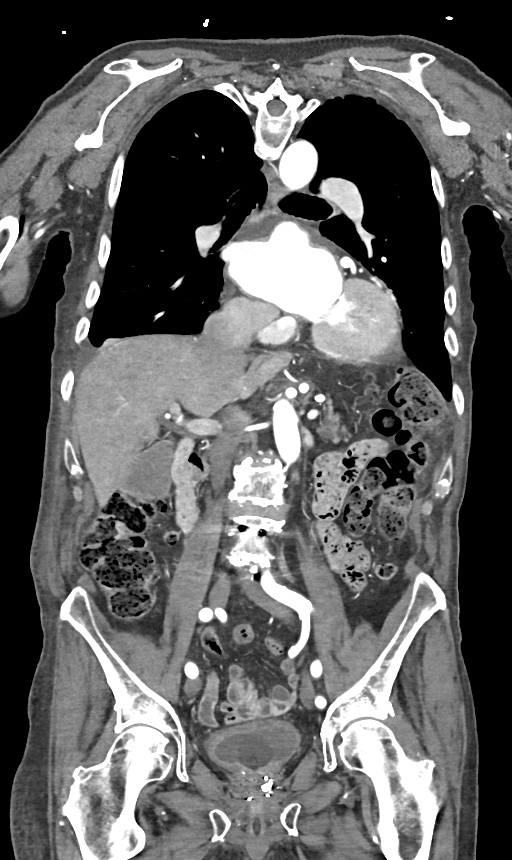
[im 66/131  bone]
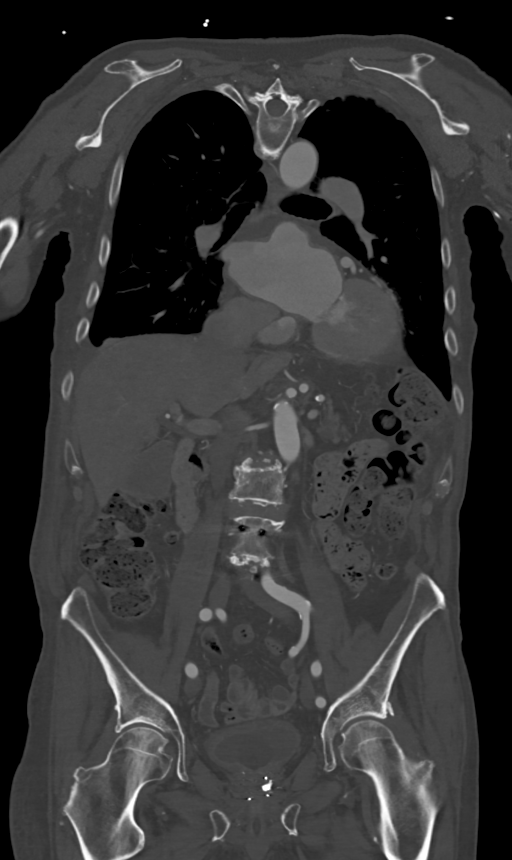

[10 of 46 positions shown; findings below may reference images not displayed]

FINDINGS: CT CHEST FINDINGS

Cardiovascular: Heart size is normal. There is no significant
pericardial fluid, thickening or pericardial calcification. There is
aortic atherosclerosis, as well as atherosclerosis of the great
vessels of the mediastinum and the coronary arteries, including
calcified atherosclerotic plaque in the left anterior descending
coronary artery. Severe thickening and calcification of the aortic
valve.

Mediastinum/Nodes: No pathologically enlarged mediastinal or hilar
lymph nodes. Large hiatal hernia. No axillary lymphadenopathy.

Lungs/Pleura: Moderate bilateral pleural effusions with some passive
subsegmental atelectasis in the lungs bilaterally. Atelectasis
and/or post infectious or inflammatory scarring in the medial aspect
of the left upper lobe. No definite suspicious appearing pulmonary
nodules or masses are noted. No acute consolidative airspace
disease.

Musculoskeletal: There are no aggressive appearing lytic or blastic
lesions noted in the visualized portions of the skeleton.

CT ABDOMEN PELVIS FINDINGS

Hepatobiliary: No suspicious cystic or solid hepatic lesions. No
intra or extrahepatic biliary ductal dilatation. Gallbladder is
normal in appearance.

Pancreas: Multiple small low-attenuation lesions scattered
throughout the body and tail of the pancreas, largest of which is in
the distal body measuring 1.3 cm in diameter (axial image 109 of
series 15). No peripancreatic fluid collections or inflammatory
changes.

Spleen: Unremarkable.

Adrenals/Urinary Tract: 11 mm low-attenuation lesion in the lower
pole of the left kidney, compatible with a simple cyst. Right kidney
and bilateral adrenal glands are normal in appearance. No
hydroureteronephrosis in the abdomen or pelvis. Foley balloon
catheter in place within the lumen of the urinary bladder which is
nearly decompressed. Mild subjective mural thickening in the urinary
bladder which could be accentuated by under distension. Tiny amount
of gas non dependently in the lumen of the urinary bladder,
presumably iatrogenic.

Stomach/Bowel: Intra-abdominal portion of the stomach is
unremarkable. No pathologic dilatation of small bowel or colon.
Numerous colonic diverticulae are noted, without surrounding
inflammatory changes to suggest an acute diverticulitis at this
time. Normal appendix.

Vascular/Lymphatic: Aortic atherosclerosis, with vascular findings
and measurements pertinent to potential TAVR procedure, as detailed
below. No aneurysm or dissection noted in the abdominal or pelvic
vasculature. No lymphadenopathy noted in the abdomen or pelvis.

Reproductive: Brachytherapy implants throughout the prostate gland.
Prostate gland and seminal vesicles are otherwise unremarkable in
appearance.

Other: No significant volume of ascites.  No pneumoperitoneum.

Musculoskeletal: Chronic appearing compression fracture of L3 with
70% loss of central vertebral body height. Status post PLIF at
L4-L5. There are no aggressive appearing lytic or blastic lesions
noted in the visualized portions of the skeleton.
IMPRESSION: 1. Vascular findings and measurements pertinent to potential TAVR
procedure, as detailed above.
2. Severe thickening calcification of the aortic valve, compatible
with reported clinical history of severe aortic stenosis.
3. Large hiatal hernia which may have implications for
intraprocedural HALPIN monitoring.
4. Moderate bilateral pleural effusions.
5. Multiple small low-attenuation lesions scattered throughout the
body and tail of the pancreas measuring up to 1.3 cm in diameter in
the distal body. These are nonspecific, but the possibility of
multifocal side branch IPMN (intraductal papillary mucinous
neoplasm) should be considered. Given the confluency of these small
lesions in the tail of the pancreas, further evaluation with
nonemergent abdominal MRI with and without IV gadolinium with MRCP
is recommended at this time to better evaluate these findings and
establish a baseline for future follow-up examinations.
6. Severe colonic diverticulosis without evidence of acute
diverticulitis at this time.
7. Additional incidental findings, as above.

## 2020-08-08 IMAGING — CR DG CHEST 2V
2 series · 2 of 2 positions shown · non-contrast
Comparison: 01/01/2020

CLINICAL DATA: Preop TAVR 03/31/2020.

EXAM:
CHEST - 2 VIEW

[w chest pa]
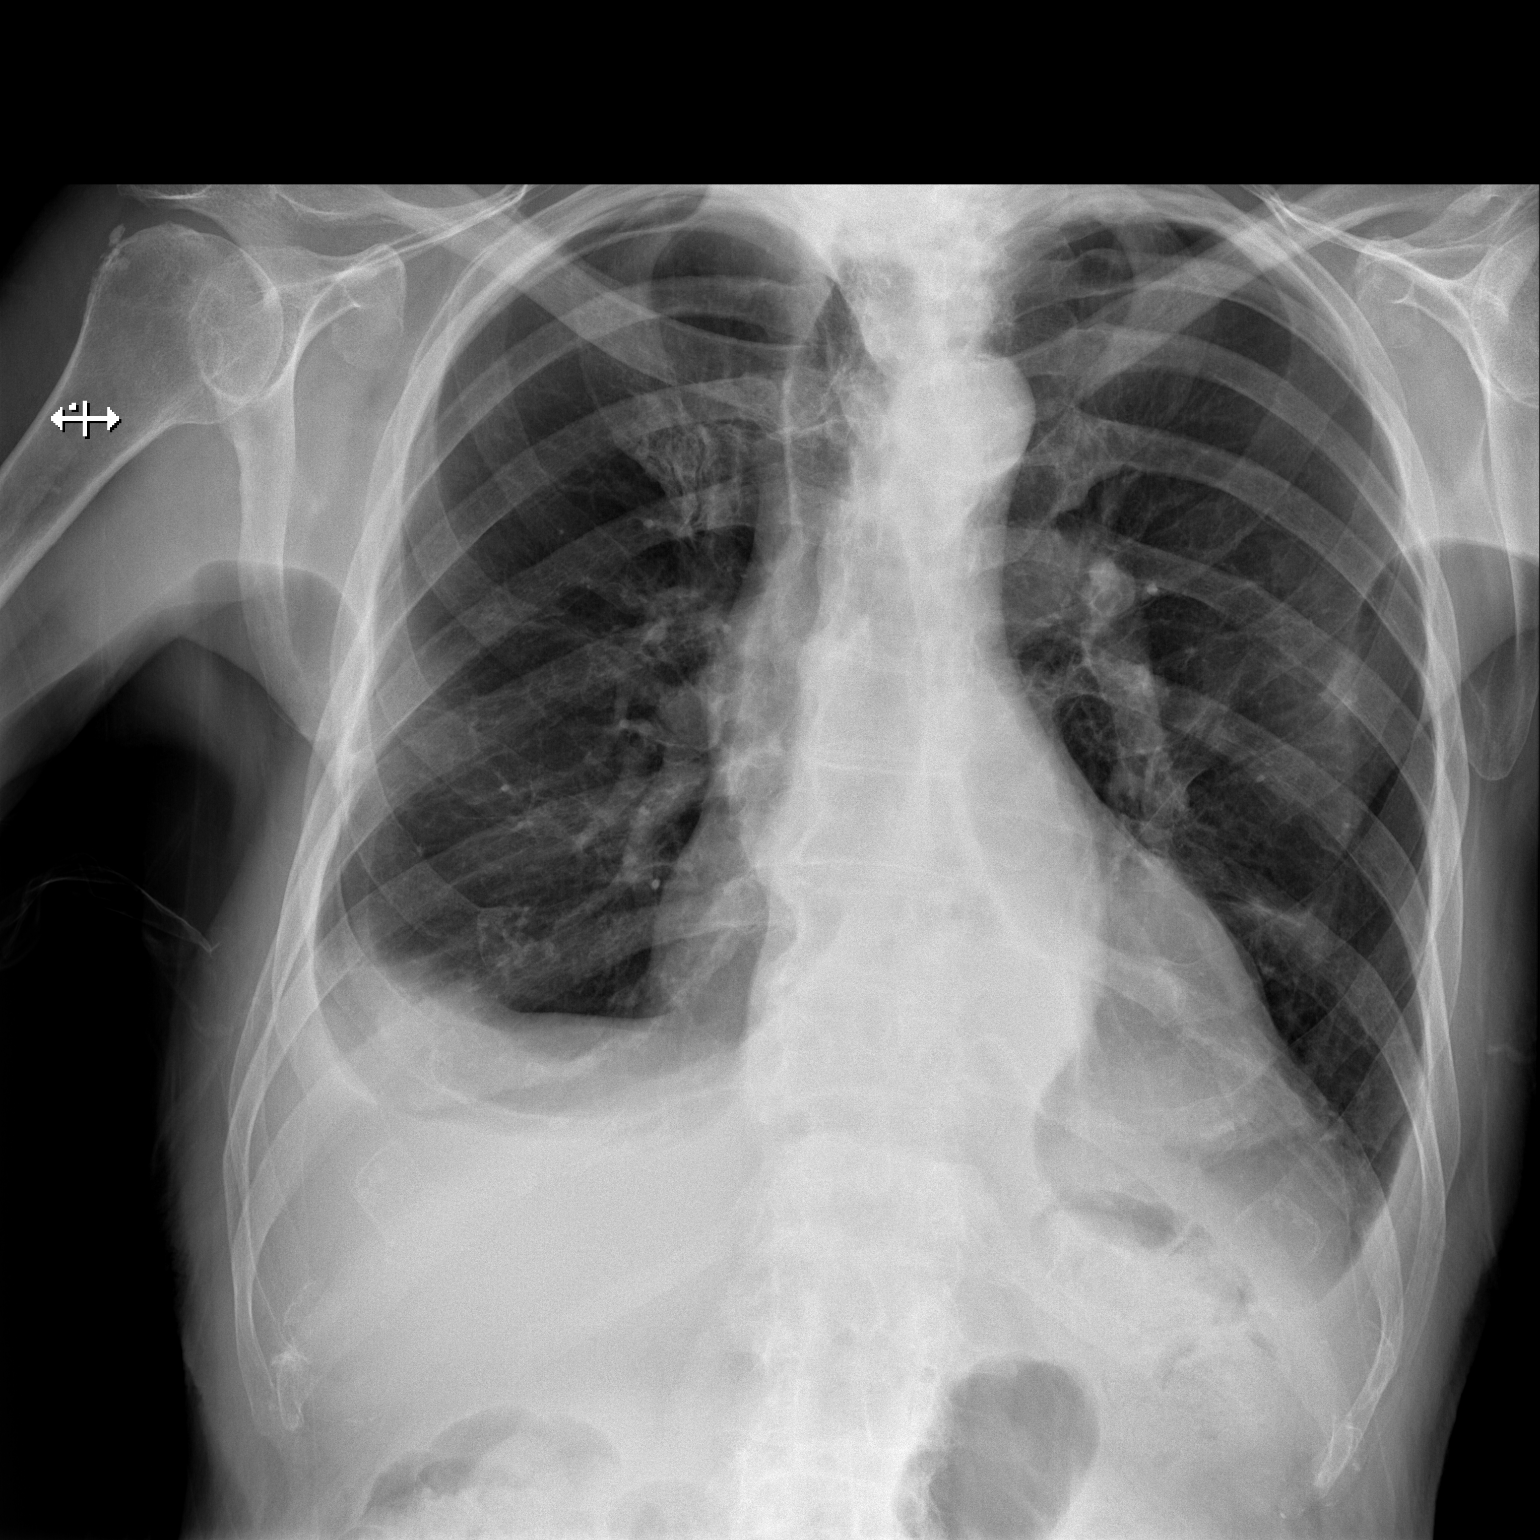

[w chest lat]
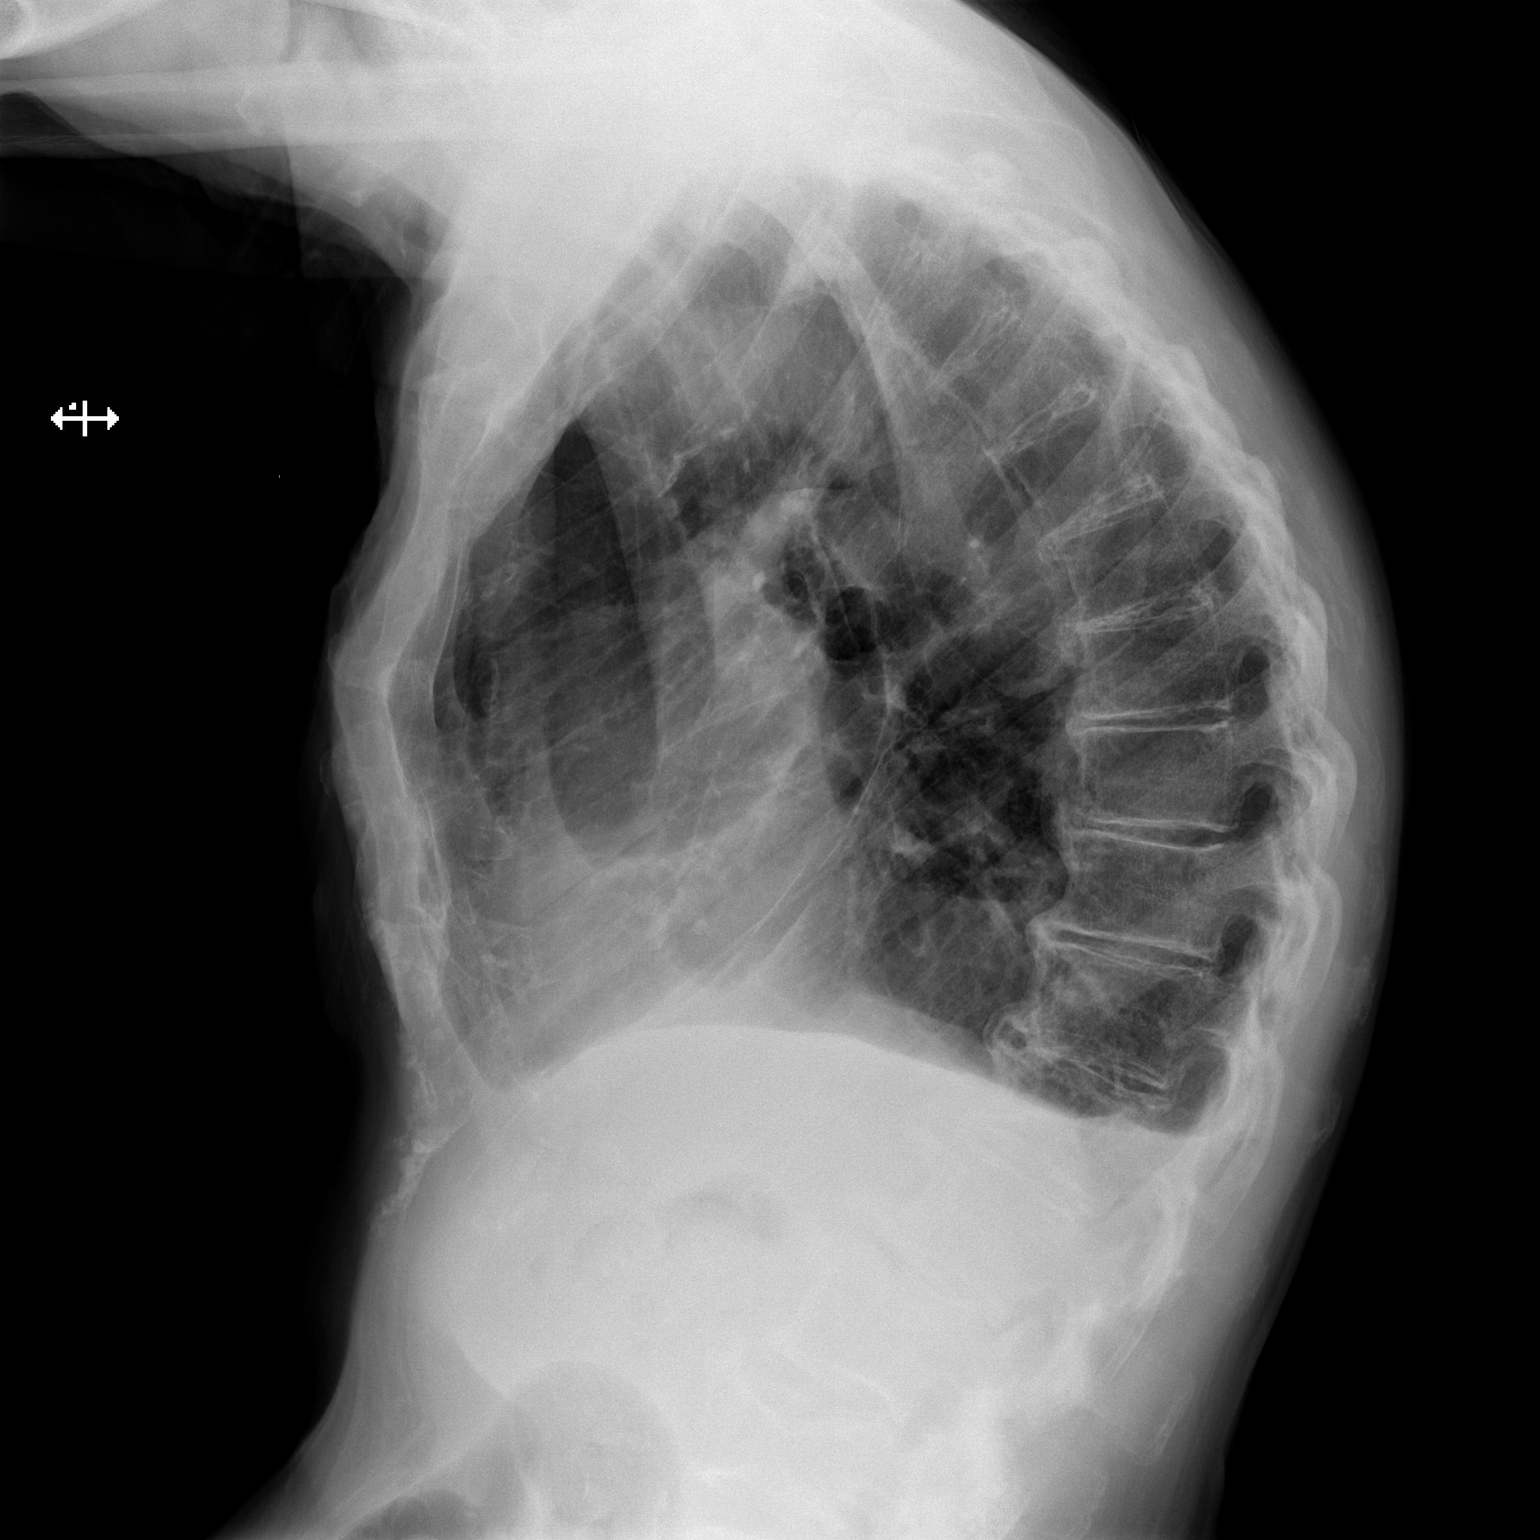

[2 of 2 positions shown; findings below may reference images not displayed]

FINDINGS: Lungs are adequately inflated demonstrate a small amount of
bilateral pleural fluid with flattening of the hemidiaphragms. No
focal airspace process. Cardiomediastinal silhouette and remainder
the exam is unchanged.
IMPRESSION: No evidence of a small amount of bilateral pleural fluid.
